# Patient Record
Sex: Male | Born: 1980 | Race: Black or African American | Hispanic: No | Marital: Single | Smoking: Current every day smoker
Health system: Southern US, Community
[De-identification: ages and names within clinical notes are randomized; demographics above are authoritative.]

## PROBLEM LIST (undated history)

## (undated) DIAGNOSIS — K610 Anal abscess: Secondary | ICD-10-CM

## (undated) DIAGNOSIS — I201 Angina pectoris with documented spasm: Secondary | ICD-10-CM

## (undated) DIAGNOSIS — I252 Old myocardial infarction: Secondary | ICD-10-CM

## (undated) DIAGNOSIS — E785 Hyperlipidemia, unspecified: Secondary | ICD-10-CM

## (undated) HISTORY — PX: OTHER SURGICAL HISTORY: SHX169

---

## 2000-05-04 ENCOUNTER — Emergency Department (HOSPITAL_COMMUNITY): Admission: EM | Admit: 2000-05-04 | Discharge: 2000-05-04 | Payer: Self-pay | Admitting: Emergency Medicine

## 2000-08-06 ENCOUNTER — Emergency Department (HOSPITAL_COMMUNITY): Admission: EM | Admit: 2000-08-06 | Discharge: 2000-08-06 | Payer: Self-pay | Admitting: Emergency Medicine

## 2001-01-20 ENCOUNTER — Emergency Department (HOSPITAL_COMMUNITY): Admission: EM | Admit: 2001-01-20 | Discharge: 2001-01-20 | Payer: Self-pay

## 2001-03-28 ENCOUNTER — Emergency Department (HOSPITAL_COMMUNITY): Admission: EM | Admit: 2001-03-28 | Discharge: 2001-03-28 | Payer: Self-pay | Admitting: Emergency Medicine

## 2001-11-16 ENCOUNTER — Emergency Department (HOSPITAL_COMMUNITY): Admission: EM | Admit: 2001-11-16 | Discharge: 2001-11-17 | Payer: Self-pay | Admitting: Emergency Medicine

## 2005-01-20 ENCOUNTER — Emergency Department (HOSPITAL_COMMUNITY): Admission: EM | Admit: 2005-01-20 | Discharge: 2005-01-20 | Payer: Self-pay | Admitting: Emergency Medicine

## 2006-01-23 ENCOUNTER — Emergency Department (HOSPITAL_COMMUNITY): Admission: EM | Admit: 2006-01-23 | Discharge: 2006-01-24 | Payer: Self-pay | Admitting: Emergency Medicine

## 2006-01-26 ENCOUNTER — Emergency Department (HOSPITAL_COMMUNITY): Admission: EM | Admit: 2006-01-26 | Discharge: 2006-01-26 | Payer: Self-pay | Admitting: Family Medicine

## 2006-08-31 ENCOUNTER — Emergency Department (HOSPITAL_COMMUNITY): Admission: EM | Admit: 2006-08-31 | Discharge: 2006-08-31 | Payer: Self-pay | Admitting: Emergency Medicine

## 2006-09-03 ENCOUNTER — Emergency Department (HOSPITAL_COMMUNITY): Admission: EM | Admit: 2006-09-03 | Discharge: 2006-09-03 | Payer: Self-pay | Admitting: *Deleted

## 2006-09-22 ENCOUNTER — Emergency Department (HOSPITAL_COMMUNITY): Admission: EM | Admit: 2006-09-22 | Discharge: 2006-09-22 | Payer: Self-pay | Admitting: Emergency Medicine

## 2007-01-23 ENCOUNTER — Inpatient Hospital Stay (HOSPITAL_COMMUNITY): Admission: EM | Admit: 2007-01-23 | Discharge: 2007-01-26 | Payer: Self-pay | Admitting: Emergency Medicine

## 2007-03-13 ENCOUNTER — Emergency Department (HOSPITAL_COMMUNITY): Admission: EM | Admit: 2007-03-13 | Discharge: 2007-03-13 | Payer: Self-pay | Admitting: Emergency Medicine

## 2007-04-18 ENCOUNTER — Encounter (INDEPENDENT_AMBULATORY_CARE_PROVIDER_SITE_OTHER): Payer: Self-pay | Admitting: Cardiology

## 2007-04-18 ENCOUNTER — Ambulatory Visit (HOSPITAL_COMMUNITY): Admission: RE | Admit: 2007-04-18 | Discharge: 2007-04-18 | Payer: Self-pay | Admitting: Cardiology

## 2007-07-05 ENCOUNTER — Emergency Department (HOSPITAL_COMMUNITY): Admission: EM | Admit: 2007-07-05 | Discharge: 2007-07-06 | Payer: Self-pay | Admitting: Emergency Medicine

## 2007-08-07 ENCOUNTER — Emergency Department (HOSPITAL_COMMUNITY): Admission: EM | Admit: 2007-08-07 | Discharge: 2007-08-07 | Payer: Self-pay | Admitting: Emergency Medicine

## 2007-10-04 ENCOUNTER — Ambulatory Visit: Payer: Self-pay | Admitting: *Deleted

## 2008-03-11 ENCOUNTER — Emergency Department (HOSPITAL_COMMUNITY): Admission: EM | Admit: 2008-03-11 | Discharge: 2008-03-11 | Payer: Self-pay | Admitting: Emergency Medicine

## 2008-07-30 ENCOUNTER — Emergency Department (HOSPITAL_COMMUNITY): Admission: EM | Admit: 2008-07-30 | Discharge: 2008-07-30 | Payer: Self-pay | Admitting: Emergency Medicine

## 2008-08-08 ENCOUNTER — Emergency Department (HOSPITAL_COMMUNITY): Admission: EM | Admit: 2008-08-08 | Discharge: 2008-08-08 | Payer: Self-pay | Admitting: Emergency Medicine

## 2010-04-06 ENCOUNTER — Inpatient Hospital Stay (INDEPENDENT_AMBULATORY_CARE_PROVIDER_SITE_OTHER)
Admission: RE | Admit: 2010-04-06 | Discharge: 2010-04-06 | Disposition: A | Discharge status: Home / Self Care | Payer: Self-pay | Source: Ambulatory Visit | Attending: Family Medicine | Admitting: Family Medicine

## 2010-04-06 DIAGNOSIS — K029 Dental caries, unspecified: Secondary | ICD-10-CM

## 2010-07-07 NOTE — Cardiovascular Report (Signed)
[name], Phillip [name] NO.:  [id number]   MEDICAL RECORD NO.:  [id number]          PATIENT TYPE:  INP   LOCATION:  2807                         FACILITY:  [facility]   PHYSICIAN:  Ritta Slot, MD     DATE OF BIRTH:  1980/03/22   DATE OF PROCEDURE:  01/23/2007  DATE OF DISCHARGE:                            CARDIAC CATHETERIZATION   PRIMARY CARDIOLOGIST:  Dr. Ritta Slot.   PROCEDURE:  1. Cardiac catheterization.  2. Retrograde coronary angiography.  3. Selective visualization of the coronary arteries.  4. Left ventriculography.  5. Intracoronary nitroglycerin administration.   INDICATIONS FOR PROCEDURE:  This is a 30 year old smoker who presented  to [facility] on January 23, 2007 with 7/10 chest pain with ST  elevation, inferior leads.  He was walking for his bus at 7:30 this  morning when he noticed chest discomfort and tightness all across his  chest with no radiation.  He came to [facility] ER where he was seen and  evaluated.  A 12-lead ECG demonstrated an inferior ST elevation MI.   PROCEDURE:  As a consequence, he was brought to the cardiac  catheterization laboratory emergently for further visualization of his  coronary angiogram.  The patient was, after explanation of the risks and  benefits of the procedure, he consented to undergoing coronary  angiography emergently.  He was brought to the second floor of the [facility] to the cardiac catheterization laboratory.  He was prepped  and draped in the usual sterile fashion.  His right groin was shaved and  cleaned with an aseptic technique.  Ten milliliters of 1% lidocaine was  infiltrated in his right groin for local anesthesia.  He was given 1 mg  of versed and 25 mcg of fentanyl intravenously for sedation.  The right  groin was accessed using a Cook needle.  A J-wire was passed into the  Henri[place] needle into the RFA to access the groin.  The Cook needle was  removed and a 6 [place]  sheath was placed into the right femoral artery.  Through the 6 French sheath was placed a 6 [place] catheter.  It was  passed into the right coronary artery.  Views of the right coronary  artery were obtained in the RAO cranial and LAO cranial approach.  Of  concern, there was significant damping at the time of insertion of the 6  [place] JR4 catheter.  Two hundred milliliters of nitroglycerin were  injected into the right coronary artery to alleviate the spasm.  The JR4  catheter was then exchanged over the J-wire for a JL4 catheter.  The JL4  catheter was then engaged into the left main coronary artery.  Selective  visualization of the left main coronary artery of the left anterior  descending and the left circumflex system were performed in the LAO  cranial, LAO caudal, RAO cranial, RAO caudal views.  The JL4 catheter  was then removed and the 5 [place] JR4 catheter was then reintroduced  into the right coronary artery to asses the degree of spasm.  Selective  visualization  of the RCA was then repeated in the RAO cranial and LAO  cranial views.  The 5 [place] JR4 catheter was then exchanged over the  pigtail with a 6 French bent pigtail catheter.  The pigtail catheter was  then inserted into the left ventricle with difficulty.  Thirty-six  milliliters of contrast were injected at a rate of 12 mL per second into  the left ventricle.  Visualization of left ventricle was performed in  the area of 45-degree oblique.  The pigtail catheter was then removed  off the J-wire.  The RFA stick site was then assessed using 5 mL of dye  with mixed 5 mL of saline.  Visualization of the right femoral artery  was performed at 45 degrees RAO oblique views.  The stick was deemed to  be suitable for insertion of StarClose device.  This was performed  smoothly without any complications.  One gram of IV Ancef was used as  antibiotic coverage.   FINDINGS:  1. AO pressure 109/70/89.  2. LV pressure is  102/0/11.  3. LV pullout pressure 103/4/7.  4. Aortic pullout pressure 105/60/87.  There was no evidence of      significant aortic gradient by the pullback technique.   Coronary angiography:  1. Right coronary artery with significant spasm at the proximal ostium      of the right coronary artery.  This was relieved with 200 mcg of      intracoronary nitroglycerin.  Repeat angiography of the right      coronary artery after insertion of intracoronary nitroglycerin      demonstrated no evidence of significant stenosis.  The right      coronary artery was large, dominant and supplying the PDA.  2. The left main was a medium-sized vessel, free of any disease.  The      circumflex system gave rise to a high OM-1 and OM-2 and an OM-3.      It was a large vessel without any evidence of significant disease.      The LAD was long and wrapped around the whole way of the apex.  It      gave rise to the septals and diagonals.  There was no evidence of      any disease in the diagonals or septals.  There was a small 100%      occlusion at the tip of the LAD at the apex.  Two hundred      micrograms of nitroglycerin were injected down the LAD to see if      there would be any relief of this obstruction.  There was no      significant relief of the obstruction.   Left ventriculography:  The EF was entirely normal except for a  hypokinetic apex which was significant, giving an EF of around 50 to  55%.   FINAL DIAGNOSES:  1. 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  2. Left ventriculography demonstrating an ejection fraction of 50 to      55% with apical hypokinesis.   PLAN:  The patient will be treated with aspirin 325 mg daily, Plavix 75  mg and then he will be on heparin drip for 48 hours inpatient.  We will  then reassess him and just let him for medical therapy.  The  angiograms  were reviewed by Dr. Lenise [name], who agrees with the above plan.      Ritta Slot, MD  Electronically Signed     HS/MEDQ  D:  01/23/2007  T:  01/23/2007  Job:  [id number]

## 2010-07-07 NOTE — Discharge Summary (Signed)
NAMEJI, FELDNER NO.:  [id number]   MEDICAL RECORD NO.:  [id number]          PATIENT TYPE:  INP   LOCATION:  3735                         FACILITY:  [facility]   PHYSICIAN:  Ritta Slot, MD     DATE OF BIRTH:  [birth year]   DATE OF ADMISSION:  01/23/2007  DATE OF DISCHARGE:  01/26/2007                               DISCHARGE SUMMARY   DISCHARGE DIAGNOSES:  1. 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  2. History of smoking.  3. Family history of coronary disease.   HOSPITAL COURSE:  The patient is a 30 year old male with no prior  history of coronary disease who presented to the emergency room January 23, 2007, with substernal chest pain.  Please see admission history and  physical for complete details.  In the emergency room, he had inferior  ST elevation.  He was seen by Dr. Lynnea Ferrier and taken urgently to the cath  lab.  Catheterization revealed normal RCA, normal left main normal ramus  intermedius, normal circumflex and total distal LAD occlusion suspected  to be thromboembolism.  EF was 55% with some apical akinesis.  The  patient was monitored in the CCU.  CKs peaked at 440 with 32 MB.  His  EKG normalized.  He was transferred to floor and ambulated.  He received  tobacco cessation counseling.  We feel he can be discharged January 26, 2007.  He will follow up with Dr. Lynnea Ferrier.  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   DISCHARGE MEDICATIONS:  1. Coated aspirin 325 mg a day.  2. Plavix 75 mg a day.  3. Metoprolol 25 mg b.i.d.  4. Lisinopril 10 mg a day.  5. Simvastatin 40 mg a day.  6. Nitroglycerin sublingual p.r.n.   LABORATORY DATA:  CK peaked at 440 with 32 MB.  Sodium 139, potassium  3.9, BUN 6, creatinine 1.  White count 11.1, hemoglobin 14.7, hematocrit  42.8, platelets 157.  Hypercoagulable panel was  obtained.  Homocysteine  is 11.4.  Drug screen was negative.  TSH is 1.15.  D-dimers less than  0.22.  UA was negative.  INR is 1.   DISPOSITION:  The patient discharged in stable condition and will follow  up with Dr. Lynnea Ferrier.     [name], P.A.      Ritta Slot, MD  Electronically Signed   LKK/MEDQ  D:  01/26/2007  T:  01/26/2007  Job:  [id number]

## 2010-07-07 NOTE — H&P (Signed)
Phillip [name], [name] NO.:  [id number]   MEDICAL RECORD NO.:  [id number]          PATIENT TYPE:  INP   LOCATION:  2807                         FACILITY:  [facility]   PHYSICIAN:  Phillip Derrick, P.A.   DATE OF BIRTH:  1980/03/02   DATE OF ADMISSION:  01/23/2007  DATE OF DISCHARGE:                              HISTORY & PHYSICAL   CHIEF COMPLAINT:  Chest pain.   HISTORY OF PRESENT ILLNESS:  The patient is a 30 year old African  American male with no prior history of coronary disease.  He presented  to the emergency room with complaints of substernal chest pressure.  The  patient said that he was walking to the bus stop about 9:30 this morning  when he developed substernal chest pressure with diaphoresis.  He went  home, but his symptoms did not subside and he came to the emergency room  at [facility].  In the ER, he had inferior ST elevation.  He is still  complaining of pain in the emergency room, 5/10.   His past medical history is negative, he has seen a dentist in the past  once or twice, but has never has serious medical problems.   HE HAS NO KNOWN DRUG ALLERGIES.   He is currently on no medications.   SOCIAL HISTORY:  He smokes a pack a day.  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  He is single and lives alone.   FAMILY HISTORY:  His mother died at 26 of complications of heart  failure.   REVIEW OF SYSTEMS:  Essentially unremarkable except for noted above.  He  denies any history of hypertension, dyslipidemia, or diabetes.   PHYSICAL EXAMINATION:  VITAL SIGNS:  Blood pressure 128/85, pulse 75,  respirations 12.  GENERAL:  He is a well-developed, thin [place] American male who is  anxious, but in no acute distress.  HEENT:  Normocephalic.  Extraocular movements are intact.  Sclerae is  nonicteric.  NECK:  Without JVD or bruits.  CHEST:  Clear to auscultation and percussion with diminished breath  sounds.  CARDIAC EXAM:  Reveals regular rate and  rhythm with diminished heart  sounds.  ABDOMEN:  Nontender.  Not distended.  EXTREMITIES:  Without edema.  Pulses are intact.  NEURO EXAM:  Is grossly intact.  He is awake, alert, and oriented.  Cooperative.  Moves all extremities without obvious deficits.  SKIN:  Warm and dry.   IMPRESSION:  1. Unstable angina.  Rule out acute myocardial infarction.  2. History of smoking.  3. Family history of early cardiac death.   PLAN:  The patient was seen by Dr. Lynnea Ferrier in the emergency room.  Risks  and benefits of urgent cardiac catheterization were explained to the  patient, including the risk of death, stroke, and groin bleeding.  The  patient understands and is willing to proceed.  He was taken emergently  to the cath lab.      Phillip Derrick, P.ALenard [name]  D:  01/23/2007  T:  01/23/2007  Job:  [id number]

## 2010-11-18 LAB — POCT CARDIAC MARKERS
CKMB, poc: <1 — ABNORMAL LOW
Operator id: 295131
Troponin i, poc: <0.05

## 2010-11-30 LAB — BASIC METABOLIC PANEL
BUN: 6
CO2: 23
CO2: 27
Calcium: 8.7
Chloride: 106
Creatinine, Ser: 0.84
Creatinine, Ser: 0.91
GFR calc Af Amer: >60
GFR calc Af Amer: >60
GFR calc Af Amer: >60
GFR calc non Af Amer: >60
GFR calc non Af Amer: >60
Glucose, Bld: 135 — ABNORMAL HIGH
Potassium: 3.9
Potassium: 4
Sodium: 137
Sodium: 137
Sodium: 139

## 2010-11-30 LAB — CBC
HCT: 38.7 — ABNORMAL LOW
HCT: 42.7
HCT: 47.6
Hemoglobin: 12.9 — ABNORMAL LOW
Hemoglobin: 13.3
Hemoglobin: 14.2
Hemoglobin: 15.2
Hemoglobin: 16.2
MCHC: 33.2
MCHC: 33.5
MCHC: 33.9
MCHC: 34.1
MCHC: 34.3
MCV: 94.3
MCV: 95.3
MCV: 95.8
MCV: 96.5
MCV: 96.6
Platelets: 150
Platelets: 157
Platelets: 157
RBC: 3.92 — ABNORMAL LOW
RBC: 4.04 — ABNORMAL LOW
RBC: 4.12 — ABNORMAL LOW
RBC: 4.69
RBC: 4.99
RDW: 13.7
RDW: 13.8
RDW: 13.9
RDW: 14
RDW: 14.3
WBC: 11.1 — ABNORMAL HIGH
WBC: 12.6 — ABNORMAL HIGH
WBC: 14.1 — ABNORMAL HIGH
WBC: 14.5 — ABNORMAL HIGH

## 2010-11-30 LAB — COMPREHENSIVE METABOLIC PANEL
ALT: 20
AST: 20
Albumin: 3.7
Alkaline Phosphatase: 76
BUN: 6
Calcium: 9.1
Creatinine, Ser: 0.9
GFR calc Af Amer: >60
GFR calc non Af Amer: >60

## 2010-11-30 LAB — I-STAT 8, (EC8 V) (CONVERTED LAB)
Acid-base deficit: 1
BUN: 9
Bicarbonate: 22.9
HCT: 52
Hemoglobin: 17.7 — ABNORMAL HIGH
Operator id: 234501
Potassium: 3.8
TCO2: 24
pCO2, Ven: 37 — ABNORMAL LOW
pH, Ven: 7.399 — ABNORMAL HIGH

## 2010-11-30 LAB — URINE DRUGS OF ABUSE SCREEN W ALC, ROUTINE (REF LAB)
Benzodiazepines.: POSITIVE — AB
Cocaine Metabolites: NEGATIVE
Creatinine,U: 38.1
Opiate Screen, Urine: NEGATIVE
Phencyclidine (PCP): NEGATIVE

## 2010-11-30 LAB — CARDIAC PANEL(CRET KIN+CKTOT+MB+TROPI)
CK, MB: 20.6 — ABNORMAL HIGH
CK, MB: 32.5 — ABNORMAL HIGH
CK, MB: 38 — ABNORMAL HIGH
Relative Index: 7.4 — ABNORMAL HIGH
Total CK: 361 — ABNORMAL HIGH
Total CK: 432 — ABNORMAL HIGH
Troponin I: 0.71
Troponin I: 4.53

## 2010-11-30 LAB — DIFFERENTIAL
Basophils Absolute: 0.1
Basophils Relative: 1
Eosinophils Absolute: 0.3
Eosinophils Relative: 2
Lymphocytes Relative: 25
Lymphs Abs: 3.2
Monocytes Absolute: 0.9
Monocytes Relative: 7
Neutro Abs: 8.1 — ABNORMAL HIGH
Neutrophils Relative %: 65

## 2010-11-30 LAB — POCT CARDIAC MARKERS
Myoglobin, poc: 37.5
Operator id: 234501
Troponin i, poc: <0.05

## 2010-11-30 LAB — URINALYSIS, ROUTINE W REFLEX MICROSCOPIC
Hgb urine dipstick: NEGATIVE
Ketones, ur: NEGATIVE
Protein, ur: NEGATIVE

## 2010-11-30 LAB — POCT I-STAT CREATININE: Operator id: 234501

## 2010-11-30 LAB — PROTIME-INR
INR: 1
Prothrombin Time: 13.2
Prothrombin Time: 13.3

## 2010-11-30 LAB — BENZODIAZEPINE, QUANTITATIVE, URINE
Alprazolam (GC/LC/MS), ur confirm: NEGATIVE
Oxazepam GC/MS Conf: NEGATIVE

## 2010-11-30 LAB — LUPUS ANTICOAGULANT PANEL: PTT Lupus Anticoagulant: 44.4 (ref 36.3–48.8)

## 2010-11-30 LAB — HEPARIN LEVEL (UNFRACTIONATED)
Heparin Unfractionated: 0.66
Heparin Unfractionated: <0.1 — ABNORMAL LOW

## 2010-11-30 LAB — PROTEIN C, TOTAL: Protein C, Total: 71 % (ref 70–140)

## 2010-11-30 LAB — PROTHROMBIN GENE MUTATION

## 2010-11-30 LAB — BETA-2-GLYCOPROTEIN I ABS, IGG/M/A: Beta-2-Glycoprotein I IgA: <4 U/mL (ref <10)

## 2010-11-30 LAB — PROTEIN C ACTIVITY: Protein C Activity: 131 % (ref 75–133)

## 2010-11-30 LAB — FACTOR 5 LEIDEN

## 2010-11-30 LAB — APTT: aPTT: 30

## 2010-11-30 LAB — PROTEIN S, TOTAL: Protein S Ag, Total: 100 % (ref 70–140)

## 2010-11-30 LAB — PROTEIN S ACTIVITY: Protein S Activity: 146 % — ABNORMAL HIGH (ref 69–129)

## 2010-11-30 LAB — CARDIOLIPIN ANTIBODIES, IGG, IGM, IGA: Anticardiolipin IgG: <7 — ABNORMAL LOW (ref <11)

## 2011-03-02 ENCOUNTER — Emergency Department (HOSPITAL_COMMUNITY)
Admission: EM | Admit: 2011-03-02 | Discharge: 2011-03-02 | Disposition: A | Discharge status: Home / Self Care | Payer: Self-pay | Attending: Emergency Medicine | Admitting: Emergency Medicine

## 2011-03-02 ENCOUNTER — Emergency Department (HOSPITAL_COMMUNITY): Payer: Self-pay

## 2011-03-02 DIAGNOSIS — L0390 Cellulitis, unspecified: Secondary | ICD-10-CM | POA: Insufficient documentation

## 2011-03-02 DIAGNOSIS — L0291 Cutaneous abscess, unspecified: Secondary | ICD-10-CM | POA: Insufficient documentation

## 2011-03-02 LAB — POCT I-STAT, CHEM 8
Calcium, Ion: 1.12 mmol/L (ref 1.12–1.32)
Chloride: 103 mEq/L (ref 96–112)
Glucose, Bld: 77 mg/dL (ref 70–99)
HCT: 42 % (ref 39.0–52.0)
Hemoglobin: 14.3 g/dL (ref 13.0–17.0)
TCO2: 27 mmol/L (ref 0–100)

## 2011-03-02 LAB — DIFFERENTIAL
Basophils Absolute: 0.1 10*3/uL (ref 0.0–0.1)
Basophils Relative: 1 % (ref 0–1)
Eosinophils Absolute: 0.5 10*3/uL (ref 0.0–0.7)
Eosinophils Relative: 5 % (ref 0–5)
Monocytes Absolute: 0.8 10*3/uL (ref 0.1–1.0)
Monocytes Relative: 7 % (ref 3–12)
Neutro Abs: 6.1 10*3/uL (ref 1.7–7.7)

## 2011-03-02 LAB — CBC
HCT: 39 % (ref 39.0–52.0)
Hemoglobin: 13.3 g/dL (ref 13.0–17.0)
MCH: 31.9 pg (ref 26.0–34.0)
MCHC: 34.1 g/dL (ref 30.0–36.0)
RDW: 14.4 % (ref 11.5–15.5)

## 2011-03-02 MED ORDER — HYDROCODONE-ACETAMINOPHEN 5-325 MG PO TABS: 1.0000 | ORAL_TABLET | Freq: Four times a day (QID) | ORAL | Status: AC | PRN

## 2011-03-02 MED ORDER — IOHEXOL 300 MG/ML  SOLN: 80.0000 mL | Freq: Once | INTRAMUSCULAR | Status: DC | PRN

## 2011-03-02 MED ORDER — MORPHINE SULFATE 4 MG/ML IJ SOLN
4.0000 mg | Freq: Once | INTRAMUSCULAR | Status: AC
  Administered 2011-03-02: 4 mg via INTRAVENOUS
  Filled 2011-03-02: qty 1

## 2011-03-02 MED ORDER — DOXYCYCLINE HYCLATE 100 MG PO CAPS: 100.0000 mg | ORAL_CAPSULE | Freq: Two times a day (BID) | ORAL | Status: AC

## 2011-03-02 MED ORDER — SODIUM CHLORIDE 0.9 % IV SOLN
Freq: Once | INTRAVENOUS | Status: AC
  Administered 2011-03-02: 12:00:00 via INTRAVENOUS

## 2011-03-02 NOTE — ED Notes (Signed)
Abscess noted to lt.buttokcs.

## 2011-03-02 NOTE — ED Provider Notes (Signed)
Medical screening examination/treatment/procedure(s) were performed by non-physician practitioner and as supervising physician I was immediately available for consultation/collaboration.   Dione Booze, MD 03/02/11 2045

## 2011-03-02 NOTE — ED Notes (Signed)
Pt placed in gown and given sheet. PA at bedside.

## 2011-03-02 NOTE — ED Notes (Signed)
Pt to ED for eval of reoccurring abscess to inside of left gluteus maximus. Denies drainage or fever. States he has seen doctor and had area lanced and given antibiotics before. Pt reports area is nickel size. Reports moderate discomfort.

## 2011-03-02 NOTE — ED Provider Notes (Signed)
History     CSN: [id number]  Arrival date & time 03/02/11  1038   First MD Initiated Contact with Patient 03/02/11 1134      Chief Complaint  Patient presents with  . Abscess    (Consider location/radiation/quality/duration/timing/severity/associated sxs/prior treatment) HPI Comments: Patient reports two days of pain beside his anus.  States he has had abscesses in that area before and has had to see a surgeon to drain them.  Denies fevers, abdominal pain, penile discharge or pain, testicular pain or swelling, change in bowel movements, hematochezia.    Patient is a 31 y.o. male presenting with abscess. The history is provided by the patient.  Abscess     History reviewed. No pertinent past medical history.  History reviewed. No pertinent past surgical history.  History reviewed. No pertinent family history.  History  Substance Use Topics  . Smoking status: Never Smoker   . Smokeless tobacco: Not on file  . Alcohol Use: No      Review of Systems  Allergies  Review of patient's allergies indicates no known allergies.  Home Medications   Current Outpatient Rx  Name Route Sig Dispense Refill  . IBUPROFEN 200 MG PO TABS Oral Take 200 mg by mouth 2 (two) times daily as needed. For pain       BP 112/76  Pulse 78  Temp(Src) 98.1 F (36.7 C) (Oral)  Resp 16  Ht 5\' 6"  (1.676 m)  Wt 150 lb (68.04 kg)  BMI 24.21 kg/m2  SpO2 100%  Physical Exam  Nursing note and vitals reviewed. Constitutional: He is oriented to person, place, and time. He appears well-developed and well-nourished.  HENT:  Head: Normocephalic and atraumatic.  Neck: Neck supple.  Pulmonary/Chest: Effort normal.  Genitourinary: Testes normal and penis normal. Right testis shows no mass, no swelling and no tenderness. Left testis shows no mass, no swelling and no tenderness. Circumcised. No penile tenderness.          Scrotum nontender, no erythema or edema.    Neurological: He is alert and  oriented to person, place, and time.  Psychiatric: He has a normal mood and affect.    ED Course  Procedures (including critical care time)  Labs Reviewed  CBC - Abnormal; Notable for the following:    RBC 4.17 (*)    All other components within normal limits  DIFFERENTIAL  POCT I-STAT, CHEM 8  I-STAT, CHEM 8   Ct Pelvis W Contrast  03/02/2011  *RADIOLOGY REPORT*  Clinical Data:  Perianal versus perirectal abscess.  CT PELVIS WITH CONTRAST  Technique:  Multidetector CT imaging of the pelvis was performed using the standard protocol following the bolus administration of intravenous contrast.  Contrast:   80 ml Omnipaque-300  Comparison:  None  Findings:  The rectum, sigmoid colon and visualized pelvic small bowel loops are unremarkable.  The appendix is normal.  No intrapelvic abscess or abnormal fluid collection.  The bladder, prostate gland and seminal vesicles are unremarkable.  No perianal abnormalities identified.  The ischiorectal fat is normal.  No pelvic lymphadenopathy.  No inguinal lymphadenopathy.  The bony pelvis is unremarkable.  IMPRESSION:  No CT findings for pelvic abscess.  No perirectal or perianal abscess is identified.  MR is the best test to evaluate this disease process and is recommended if symptoms persist.  Original Report Authenticated By: P. Loralie Champagne, M.D.     1. Cellulitis       MDM  Patient with small area of perianal  swelling and tenderness.  CT ordered ? perianal vs perirectal abscess vs other mass - CT shows no abnormality.  Unclear etiology of tender area, will treat as early cellulitis.  Genital exam is completely unremarkable - so signs of inflammation or infection.  No e/o Fournier's gangrene.  Discussed all findings with patient and plan for close follow up, return for any worsening symptoms.          Rise Patience, [place] 03/02/11 1551

## 2011-03-15 ENCOUNTER — Emergency Department (HOSPITAL_COMMUNITY)
Admission: EM | Admit: 2011-03-15 | Discharge: 2011-03-15 | Disposition: A | Discharge status: Home / Self Care | Payer: Self-pay | Attending: Emergency Medicine | Admitting: Emergency Medicine

## 2011-03-15 ENCOUNTER — Encounter (HOSPITAL_COMMUNITY): Payer: Self-pay | Admitting: *Deleted

## 2011-03-15 DIAGNOSIS — R6884 Jaw pain: Secondary | ICD-10-CM | POA: Insufficient documentation

## 2011-03-15 DIAGNOSIS — K029 Dental caries, unspecified: Secondary | ICD-10-CM | POA: Insufficient documentation

## 2011-03-15 DIAGNOSIS — K089 Disorder of teeth and supporting structures, unspecified: Secondary | ICD-10-CM | POA: Insufficient documentation

## 2011-03-15 MED ORDER — PENICILLIN V POTASSIUM 500 MG PO TABS: 500.0000 mg | ORAL_TABLET | Freq: Four times a day (QID) | ORAL | Status: AC

## 2011-03-15 MED ORDER — OXYCODONE-ACETAMINOPHEN 5-325 MG PO TABS: 1.0000 | ORAL_TABLET | ORAL | Status: AC | PRN

## 2011-03-15 MED ORDER — OXYCODONE-ACETAMINOPHEN 5-325 MG PO TABS
1.0000 | ORAL_TABLET | Freq: Once | ORAL | Status: AC
  Administered 2011-03-15: 1 via ORAL
  Filled 2011-03-15: qty 1

## 2011-03-15 NOTE — ED Notes (Signed)
Rx x 2, pt voiced understanding to f/u with denist tomorrow

## 2011-03-15 NOTE — ED Notes (Signed)
Toothache for 2 days 

## 2011-03-15 NOTE — ED Provider Notes (Signed)
History     CSN: [id number]  Arrival date & time 03/15/11  1710   First MD Initiated Contact with Patient 03/15/11 1846      Chief Complaint  Patient presents with  . Dental Pain    (Consider location/radiation/quality/duration/timing/severity/associated sxs/prior treatment) Patient is a 31 y.o. male presenting with tooth pain.  Dental PainThe primary symptoms include mouth pain. Primary symptoms do not include fever, shortness of breath or cough. The symptoms began 3 to 5 days ago. The symptoms are worsening. The symptoms are new. The symptoms occur constantly.  Mouth pain occurs constantly. Mouth pain is worsening. Affected locations include: teeth and cheek(s). Pain scale currently for mouth pain: moderate.  Additional symptoms include: jaw pain. Additional symptoms do not include: gum swelling, purulent gums, trismus, facial swelling, trouble swallowing and taste disturbance.    History reviewed. No pertinent past medical history.  History reviewed. No pertinent past surgical history.  No family history on file.  History  Substance Use Topics  . Smoking status: Never Smoker   . Smokeless tobacco: Not on file  . Alcohol Use: No      Review of Systems  Constitutional: Negative for fever.  HENT: Negative for congestion, facial swelling and trouble swallowing.   Respiratory: Negative for cough and shortness of breath.   Cardiovascular: Negative for chest pain.  Gastrointestinal: Negative for nausea, vomiting, abdominal pain and diarrhea.  Genitourinary: Negative for difficulty urinating.  Skin: Negative for rash.  All other systems reviewed and are negative.    Allergies  Review of patient's allergies indicates no known allergies.  Home Medications   Current Outpatient Rx  Name Route Sig Dispense Refill  . ACETAMINOPHEN 325 MG PO TABS Oral Take 650 mg by mouth every 6 (six) hours as needed. For pain    . DOXYCYCLINE HYCLATE 100 MG PO CAPS Oral Take 100 mg by  mouth 2 (two) times daily. For 7 days; Start date 03/02/11      BP 130/88  Pulse 83  Temp(Src) 97.6 F (36.4 C) (Oral)  Resp 18  SpO2 97%  Physical Exam  Nursing note and vitals reviewed. Constitutional: He is oriented to person, place, and time. He appears well-developed and well-nourished. No distress.  HENT:  Head: Normocephalic and atraumatic. No trismus in the jaw.  Mouth/Throat: Oropharynx is clear and moist and mucous membranes are normal. No oral lesions. Dental caries present. No dental abscesses or uvula swelling.    Eyes: Conjunctivae are normal. Pupils are equal, round, and reactive to light. No scleral icterus.  Neck: Normal range of motion. Neck supple.  Cardiovascular: Normal rate, regular rhythm, normal heart sounds and intact distal pulses.   No murmur heard. Pulmonary/Chest: Effort normal and breath sounds normal. No stridor. No respiratory distress. He has no wheezes. He has no rales.  Abdominal: Soft. He exhibits no distension. There is no tenderness.  Musculoskeletal: Normal range of motion. He exhibits no edema.  Neurological: He is alert and oriented to person, place, and time.  Skin: Skin is warm and dry. No rash noted.  Psychiatric: He has a normal mood and affect. His behavior is normal.    ED Course  Procedures (including critical care time)  Labs Reviewed - No data to display No results found.   1. Dental caries       MDM  31 yo male with 3 days of dental pain and jaw pain on first lower left molar.  No fevers, no facial swelling. Severe dental caries.  No trismus.  Suspect dental caries with possible underlying dental abscess.  Don't suspect ludwig's angina or mastoiditis.  Will treat with PCN and have patient follow up with Dentistry.          Warnell Forester, MD 03/16/11 [phone]

## 2011-03-16 NOTE — ED Provider Notes (Signed)
I saw and evaluated the patient, reviewed the resident's note and I agree with the findings and plan.  Burnette Valenti T Shakara Tweedy, MD 03/16/11 0946 

## 2011-09-16 ENCOUNTER — Emergency Department (HOSPITAL_COMMUNITY)
Admission: EM | Admit: 2011-09-16 | Discharge: 2011-09-17 | Disposition: A | Discharge status: Home / Self Care | Payer: Self-pay | Attending: Emergency Medicine | Admitting: Emergency Medicine

## 2011-09-16 ENCOUNTER — Encounter (HOSPITAL_COMMUNITY): Payer: Self-pay | Admitting: *Deleted

## 2011-09-16 DIAGNOSIS — K649 Unspecified hemorrhoids: Secondary | ICD-10-CM

## 2011-09-16 DIAGNOSIS — K644 Residual hemorrhoidal skin tags: Secondary | ICD-10-CM | POA: Insufficient documentation

## 2011-09-16 NOTE — ED Notes (Signed)
Patient with recurring abcess on his left buttock area.  No drainage

## 2011-09-17 MED ORDER — DSS 100 MG PO CAPS: 100.0000 mg | ORAL_CAPSULE | Freq: Two times a day (BID) | ORAL | Status: AC

## 2011-09-17 MED ORDER — HYDROCORTISONE ACETATE 25 MG RE SUPP: 25.0000 mg | Freq: Two times a day (BID) | RECTAL | Status: AC

## 2011-09-17 MED ORDER — HYDROCORTISONE ACETATE 25 MG RE SUPP
25.0000 mg | Freq: Once | RECTAL | Status: AC
  Administered 2011-09-17: 25 mg via RECTAL
  Filled 2011-09-17 (×2): qty 1

## 2011-09-17 MED ORDER — DOCUSATE SODIUM 100 MG PO CAPS
100.0000 mg | ORAL_CAPSULE | Freq: Once | ORAL | Status: AC
  Administered 2011-09-17: 100 mg via ORAL
  Filled 2011-09-17: qty 1

## 2011-09-17 NOTE — ED Notes (Signed)
Hydrocortisone ordered from pharmacy

## 2011-09-17 NOTE — ED Notes (Signed)
Colase ordered from pharmacy.

## 2011-09-17 NOTE — ED Provider Notes (Signed)
History     CSN: [id number]  Arrival date & time 09/16/11  2137   First MD Initiated Contact with Patient 09/17/11 0025      Chief Complaint  Patient presents with  . Recurrent Skin Infections    (Consider location/radiation/quality/duration/timing/severity/associated sxs/prior treatment) HPI Comments: Patient has been complaining of a small bulge in the rectal area for the past 2, days.  Painful bowel movements.  He has a history of recurrent perirectal abscesses, although this does not feel the same.  Has not done anything for it can prior to arrival.  He denies any fever, myalgias  The history is provided by the patient.    History reviewed. No pertinent past medical history.  History reviewed. No pertinent past surgical history.  History reviewed. No pertinent family history.  History  Substance Use Topics  . Smoking status: Never Smoker   . Smokeless tobacco: Not on file  . Alcohol Use: No      Review of Systems  Constitutional: Negative for fever.  Gastrointestinal: Positive for rectal pain. Negative for diarrhea and constipation.  Genitourinary: Negative for penile pain and testicular pain.  Musculoskeletal: Negative for myalgias.  Neurological: Negative for weakness.    Allergies  Review of patient's allergies indicates no known allergies.  Home Medications   Current Outpatient Rx  Name Route Sig Dispense Refill  . IBUPROFEN 200 MG PO TABS Oral Take 800 mg by mouth every 6 (six) hours as needed. For pain    . DSS 100 MG PO CAPS Oral Take 100 mg by mouth 2 (two) times daily. 10 capsule 0  . HYDROCORTISONE ACETATE 25 MG RE SUPP Rectal Place 1 suppository (25 mg total) rectally 2 (two) times daily. 12 suppository 0    BP 105/67  Pulse 84  Temp 98.3 F (36.8 C) (Oral)  Resp 16  SpO2 99%  Physical Exam  Constitutional: He appears well-developed and well-nourished.  HENT:  Head: Normocephalic.  Eyes: Pupils are equal, round, and reactive to light.    Neck: Normal range of motion.  Cardiovascular: Normal rate.   Pulmonary/Chest: Effort normal.  Abdominal: He exhibits no distension. There is tenderness.  Genitourinary: Rectal exam shows external hemorrhoid.       Soft easily reduced.  External hemorrhoid, without evidence of thrombosis    ED Course  Procedures (including critical care time)  Labs Reviewed - No data to display No results found.   1. Hemorrhoid       MDM   Patient has a soft easily reduced.  Hemorrhoid.  Will prescribe Colace to keep his stools soft and Anusol suppositories with general surgery followup if needed        Arman Filter, NP 09/17/11 0111  Arman Filter, NP 09/17/11 0111

## 2011-09-17 NOTE — ED Provider Notes (Signed)
Medical screening examination/treatment/procedure(s) were performed by non-physician practitioner and as supervising physician I was immediately available for consultation/collaboration.  Davionte Lusby R. Fleming Prill, MD 09/17/11 0718 

## 2011-12-19 ENCOUNTER — Emergency Department (HOSPITAL_COMMUNITY)
Admission: EM | Admit: 2011-12-19 | Discharge: 2011-12-19 | Disposition: A | Discharge status: Home / Self Care | Payer: Self-pay | Attending: Emergency Medicine | Admitting: Emergency Medicine

## 2011-12-19 ENCOUNTER — Encounter (HOSPITAL_COMMUNITY): Payer: Self-pay | Admitting: Emergency Medicine

## 2011-12-19 DIAGNOSIS — Y9289 Other specified places as the place of occurrence of the external cause: Secondary | ICD-10-CM | POA: Insufficient documentation

## 2011-12-19 DIAGNOSIS — Z8679 Personal history of other diseases of the circulatory system: Secondary | ICD-10-CM | POA: Insufficient documentation

## 2011-12-19 DIAGNOSIS — X088XXA Exposure to other specified smoke, fire and flames, initial encounter: Secondary | ICD-10-CM | POA: Insufficient documentation

## 2011-12-19 DIAGNOSIS — F17200 Nicotine dependence, unspecified, uncomplicated: Secondary | ICD-10-CM | POA: Insufficient documentation

## 2011-12-19 DIAGNOSIS — Y93G9 Activity, other involving cooking and grilling: Secondary | ICD-10-CM | POA: Insufficient documentation

## 2011-12-19 DIAGNOSIS — T300 Burn of unspecified body region, unspecified degree: Secondary | ICD-10-CM

## 2011-12-19 DIAGNOSIS — I259 Chronic ischemic heart disease, unspecified: Secondary | ICD-10-CM | POA: Insufficient documentation

## 2011-12-19 DIAGNOSIS — Z9861 Coronary angioplasty status: Secondary | ICD-10-CM | POA: Insufficient documentation

## 2011-12-19 DIAGNOSIS — T2010XA Burn of first degree of head, face, and neck, unspecified site, initial encounter: Secondary | ICD-10-CM | POA: Insufficient documentation

## 2011-12-19 MED ORDER — NAPROXEN 500 MG PO TABS: 500.0000 mg | ORAL_TABLET | Freq: Two times a day (BID) | ORAL | Status: DC

## 2011-12-19 MED ORDER — HYDROCODONE-ACETAMINOPHEN 5-325 MG PO TABS: 1.0000 | ORAL_TABLET | Freq: Four times a day (QID) | ORAL | Status: DC | PRN

## 2011-12-19 NOTE — ED Notes (Signed)
Pt. 's airway intact , no distress noted.  Pt. Does not have any burnt nose hairs.  Does have singed eyelashes.
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
## 2012-05-01 ENCOUNTER — Emergency Department (INDEPENDENT_AMBULATORY_CARE_PROVIDER_SITE_OTHER): Payer: Self-pay

## 2012-05-01 ENCOUNTER — Encounter (HOSPITAL_COMMUNITY): Payer: Self-pay | Admitting: Emergency Medicine

## 2012-05-01 ENCOUNTER — Emergency Department (INDEPENDENT_AMBULATORY_CARE_PROVIDER_SITE_OTHER)
Admission: EM | Admit: 2012-05-01 | Discharge: 2012-05-01 | Disposition: A | Discharge status: Home / Self Care | Payer: Self-pay | Source: Home / Self Care

## 2012-05-01 DIAGNOSIS — IMO0002 Reserved for concepts with insufficient information to code with codable children: Secondary | ICD-10-CM

## 2012-05-01 DIAGNOSIS — S39012A Strain of muscle, fascia and tendon of lower back, initial encounter: Secondary | ICD-10-CM

## 2012-05-01 MED ORDER — TROLAMINE SALICYLATE 10 % EX CREA: TOPICAL_CREAM | CUTANEOUS | Status: DC | PRN

## 2012-05-01 MED ORDER — RABIES VACCINE, PCEC IM SUSR
INTRAMUSCULAR | Status: AC
  Filled 2012-05-01: qty 1

## 2012-05-01 MED ORDER — NAPROXEN 250 MG PO TABS: 250.0000 mg | ORAL_TABLET | Freq: Two times a day (BID) | ORAL | Status: DC
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
## 2012-09-29 ENCOUNTER — Encounter (HOSPITAL_COMMUNITY): Payer: Self-pay | Admitting: Emergency Medicine

## 2012-09-29 ENCOUNTER — Emergency Department (HOSPITAL_COMMUNITY)
Admission: EM | Admit: 2012-09-29 | Discharge: 2012-09-29 | Disposition: A | Discharge status: Home / Self Care | Payer: No Typology Code available for payment source | Attending: Emergency Medicine | Admitting: Emergency Medicine

## 2012-09-29 ENCOUNTER — Emergency Department (HOSPITAL_COMMUNITY): Payer: No Typology Code available for payment source

## 2012-09-29 DIAGNOSIS — M79609 Pain in unspecified limb: Secondary | ICD-10-CM | POA: Insufficient documentation

## 2012-09-29 DIAGNOSIS — I2510 Atherosclerotic heart disease of native coronary artery without angina pectoris: Secondary | ICD-10-CM | POA: Insufficient documentation

## 2012-09-29 DIAGNOSIS — M7989 Other specified soft tissue disorders: Secondary | ICD-10-CM | POA: Insufficient documentation

## 2012-09-29 DIAGNOSIS — F17200 Nicotine dependence, unspecified, uncomplicated: Secondary | ICD-10-CM | POA: Insufficient documentation

## 2012-09-29 DIAGNOSIS — M79644 Pain in right finger(s): Secondary | ICD-10-CM

## 2012-09-29 DIAGNOSIS — Z9861 Coronary angioplasty status: Secondary | ICD-10-CM | POA: Insufficient documentation

## 2012-09-29 DIAGNOSIS — Z8674 Personal history of sudden cardiac arrest: Secondary | ICD-10-CM | POA: Insufficient documentation

## 2012-09-29 NOTE — ED Notes (Signed)
Pt presents to ED with right index finger pain.  Pt states he pinched his finger 2 years ago and has intermittent pain since.  Pt has noticed increased size of finger over the past week with increased pain.

## 2012-09-29 NOTE — ED Provider Notes (Signed)
CSN: [id number]     Arrival date & time 09/29/12  1527 History  This chart was scribed for non-physician practitioner, Jimmye Norman, NP, working with Enid Skeens, MD by Shari Heritage, ED Scribe. This patient was seen in room TR10C/TR10C and the patient's care was started at 1614.    First MD Initiated Contact with Patient 09/29/12 1614     Chief Complaint  Patient presents with  . Hand Pain    Patient is a 32 y.o. male presenting with hand pain. The history is provided by the patient. No language interpreter was used.  Hand Pain This is a new problem. The current episode started 2 days ago. The problem occurs constantly. The problem has been gradually worsening. Pertinent negatives include no headaches. Nothing aggravates the symptoms. Nothing relieves the symptoms. He has tried nothing for the symptoms.     HPI Comments: Phillip [name] is a 32 y.o. male who presents to the Emergency Department complaining of pain to right index finger with associated swelling onset 2 days ago. Pain is moderate in quality and intermittent in timing. Pain is worse with palpation. Patient states that swelling has increased since initial onset of symptoms. He denies any other symptoms at this time. He does not take any medicines regularly. He denies history of diabetes. He is a current every day smoker.   Past Medical History  Diagnosis Date  . Past heart attack   . Coronary artery disease   . History of right heart catheterization 2008   History reviewed. No pertinent past surgical history. No family history on file. History  Substance Use Topics  . Smoking status: Current Every Day Smoker  . Smokeless tobacco: Not on file  . Alcohol Use: Yes    Review of Systems  Constitutional: Negative for fever.  HENT: Negative for sore throat.   Gastrointestinal: Negative for nausea.  Musculoskeletal: Negative for back pain.  Neurological: Negative for headaches.  All other systems reviewed and are  negative.    Allergies  Review of patient's allergies indicates no known allergies.  Home Medications   Current Outpatient Rx  Name  Route  Sig  Dispense  Refill  . ibuprofen (ADVIL,MOTRIN) 200 MG tablet   Oral   Take 800 mg by mouth every 6 (six) hours as needed for pain or headache.          Triage Vitals: BP 127/77  Pulse 69  Temp(Src) 97.9 F (36.6 C) (Oral)  Resp 16  Ht 5\' 6"  (1.676 m)  Wt 130 lb (58.968 kg)  BMI 20.99 kg/m2  SpO2 100%  Physical Exam  Nursing note and vitals reviewed. Constitutional: He is oriented to person, place, and time. He appears well-developed and well-nourished. No distress.  HENT:  Head: Normocephalic and atraumatic.  Eyes: EOM are normal.  Neck: Neck supple. No tracheal deviation present.  Cardiovascular: Normal rate.   Pulmonary/Chest: Effort normal. No respiratory distress.  Musculoskeletal: Normal range of motion.       Right hand: He exhibits swelling. Normal sensation noted.  Swelling to the palmar aspect of the DIP joint of the right index finger. Sensation to all right fingers is intact.  Neurological: He is alert and oriented to person, place, and time.  Skin: Skin is warm and dry.  Psychiatric: He has a normal mood and affect. His behavior is normal.    ED Course   Procedures (including critical care time) Apiration of blood/fluid Performed by: Jimmye Norman Consent obtained.  Patient identity  confirmed: verbally with patient Time out: Immediately prior to procedure a "time out" was called to verify the correct patient, procedure, equipment, support staff and site/side marked as required. Preparation: Patient was prepped and draped in the usual sterile fashion. Patient tolerance: Patient tolerated the procedure well with no immediate complications.  Location of aspiration: MIP right index finger, palmer aspect  Local anesthetic with 1 cc 2% lidocaine.  Needle aspiration attempted, no return of fluid.  DIAGNOSTIC  STUDIES: Bedside ultrasound performed by Dr. Jodi Mourning. Oxygen Saturation is 100% on room air, normal by my interpretation.    COORDINATION OF CARE: 4:19 PM- Will order a an x-ray of right index finger to rule out bony fracture. Suspect soft tissue involvement only. Patient informed of current plan for treatment and evaluation and agrees with plan at this time.   5:09 PM- Patient's X-ray showed soft tissue swelling volar to second middle phalanx with no specific etiology. Spoke with attending physician Dr. Blane Ohara regarding patient's case. He will perform a bedside US for more information.   Labs Reviewed - No data to display   Dg Finger Index Right  09/29/2012   *RADIOLOGY REPORT*  Clinical Data: Pain and swelling  RIGHT INDEX FINGER 2+V  Comparison: None.  Findings: Frontal, oblique, and lateral views were obtained.  There is marked swelling volar to the second middle phalanx.  There is no soft tissue air, calcification, or radiopaque foreign body in this region.  No fracture or dislocation.  Joint spaces appear intact. No erosive change or bony destruction.  IMPRESSION: Soft tissue swelling volar to the second middle phalanx of uncertain etiology.  No soft tissue calcification or radiopaque foreign body identified.  No soft tissue air in this region.  No bony abnormality.   Original Report Authenticated By: Bretta Bang, M.D.    No diagnosis found.  MDM  Right index finger pain with soft tissue swelling palmer aspect of MIP.  Patient to follow-up with hand specialist.         Jimmye Norman, NP 09/29/12 1737

## 2012-10-01 NOTE — ED Provider Notes (Signed)
Medical screening examination/treatment/procedure(s) were conducted as a shared visit with non-physician practitioner(s) or resident  and myself.  I personally evaluated the patient during the encounter and agree with the findings and plan unless otherwise indicated.  Focal swelling.  No other signs of infection.  Focal pain on exam, no streaking or warmth.   Bedside US small amount of fluid. No pus on aspiration.  Close fup discussed.   EMERGENCY DEPARTMENT US SOFT TISSUE INTERPRETATION "Study: Limited Ultrasound of the noted body part in comments below"  INDICATIONS: Pain and Other (refer to comments) Multiple views of the body part are obtained with a multi-frequency linear probe  PERFORMED BY:  Myself  IMAGES ARCHIVED?: Yes  SIDE:Right   BODY PART:Other soft tisse (comment in note) index finger  FINDINGS: Other small fluid collection present  INTERPRETATION:  Small fluid collection present    Phillip Skeens, MD 10/01/12 0028

## 2012-11-22 ENCOUNTER — Emergency Department (HOSPITAL_COMMUNITY): Payer: PRIVATE HEALTH INSURANCE

## 2012-11-22 ENCOUNTER — Emergency Department (HOSPITAL_COMMUNITY)
Admission: EM | Admit: 2012-11-22 | Discharge: 2012-11-22 | Disposition: A | Discharge status: Home / Self Care | Payer: PRIVATE HEALTH INSURANCE | Attending: Emergency Medicine | Admitting: Emergency Medicine

## 2012-11-22 ENCOUNTER — Encounter (HOSPITAL_COMMUNITY): Payer: Self-pay | Admitting: Emergency Medicine

## 2012-11-22 DIAGNOSIS — R071 Chest pain on breathing: Secondary | ICD-10-CM | POA: Insufficient documentation

## 2012-11-22 DIAGNOSIS — F17200 Nicotine dependence, unspecified, uncomplicated: Secondary | ICD-10-CM | POA: Insufficient documentation

## 2012-11-22 DIAGNOSIS — I2510 Atherosclerotic heart disease of native coronary artery without angina pectoris: Secondary | ICD-10-CM | POA: Insufficient documentation

## 2012-11-22 DIAGNOSIS — R0789 Other chest pain: Secondary | ICD-10-CM

## 2012-11-22 DIAGNOSIS — Z8674 Personal history of sudden cardiac arrest: Secondary | ICD-10-CM | POA: Insufficient documentation

## 2012-11-22 DIAGNOSIS — R55 Syncope and collapse: Secondary | ICD-10-CM | POA: Insufficient documentation

## 2012-11-22 LAB — CBC WITH DIFFERENTIAL/PLATELET
Basophils Absolute: 0.1 10*3/uL (ref 0.0–0.1)
Eosinophils Absolute: 0.3 10*3/uL (ref 0.0–0.7)
Lymphocytes Relative: 28 % (ref 12–46)
Lymphs Abs: 3 10*3/uL (ref 0.7–4.0)
MCH: 31.9 pg (ref 26.0–34.0)
MCHC: 34.9 g/dL (ref 30.0–36.0)
MCV: 91.6 fL (ref 78.0–100.0)
Neutrophils Relative %: 59 % (ref 43–77)
Platelets: 215 10*3/uL (ref 150–400)
RBC: 4.04 MIL/uL — ABNORMAL LOW (ref 4.22–5.81)
RDW: 14 % (ref 11.5–15.5)
WBC: 10.6 10*3/uL — ABNORMAL HIGH (ref 4.0–10.5)

## 2012-11-22 LAB — POCT I-STAT TROPONIN I: Troponin i, poc: 0 ng/mL (ref 0.00–0.08)

## 2012-11-22 LAB — COMPREHENSIVE METABOLIC PANEL
ALT: 16 U/L (ref 0–53)
AST: 20 U/L (ref 0–37)
Albumin: 3.5 g/dL (ref 3.5–5.2)
CO2: 28 mEq/L (ref 19–32)
Calcium: 8.3 mg/dL — ABNORMAL LOW (ref 8.4–10.5)
Creatinine, Ser: 1 mg/dL (ref 0.50–1.35)
GFR calc non Af Amer: >90 mL/min (ref >90)
Sodium: 141 mEq/L (ref 135–145)
Total Protein: 6.4 g/dL (ref 6.0–8.3)

## 2012-11-22 MED ORDER — IBUPROFEN 800 MG PO TABS: 800.0000 mg | ORAL_TABLET | Freq: Three times a day (TID) | ORAL | Status: DC

## 2012-11-22 NOTE — ED Provider Notes (Signed)
CSN: [id number]     Arrival date & time 11/22/12  1320 History   First MD Initiated Contact with Patient 11/22/12 1408     Chief Complaint  Patient presents with  . Chest Pain   (Consider location/radiation/quality/duration/timing/severity/associated sxs/prior Treatment) Patient is a 32 y.o. male presenting with chest pain. The history is provided by the patient. No language interpreter was used.  Chest Pain Pain location:  L chest Pain quality: sharp   Pain radiates to:  Does not radiate Associated symptoms: no abdominal pain, no cough, no fever, no nausea, no shortness of breath and not vomiting   Associated symptoms comment:  He has had left lateral chest wall pain since falling after syncope 5 days ago. He reports one syncopal episode lasting less than 2 minutes after brief episode of dizziness. No chest pain, SOB, nausea, recent illness or history of syncope. Since that time, he has had pain in the left side and left lower chest with movement. No cough, pain with respiration, abdominal pain, nausea, fever or vomiting.   Past Medical History  Diagnosis Date  . Past heart attack   . Coronary artery disease   . History of right heart catheterization 2008   History reviewed. No pertinent past surgical history. No family history on file. History  Substance Use Topics  . Smoking status: Current Every Day Smoker  . Smokeless tobacco: Not on file  . Alcohol Use: Yes    Review of Systems  Constitutional: Negative for fever and chills.  Respiratory: Negative.  Negative for cough and shortness of breath.   Cardiovascular: Positive for chest pain.  Gastrointestinal: Negative.  Negative for nausea, vomiting and abdominal pain.  Musculoskeletal: Negative.  Negative for myalgias.  Skin: Negative.   Neurological: Positive for syncope.       See HPI.    Allergies  Review of patient's allergies indicates no known allergies.  Home Medications  No current outpatient prescriptions on  file. BP 109/69  Pulse 62  Temp(Src) 98.1 F (36.7 C)  Resp 17  SpO2 100% Physical Exam  Constitutional: He is oriented to person, place, and time. He appears well-developed and well-nourished.  HENT:  Head: Normocephalic.  Neck: Normal range of motion. Neck supple.  Cardiovascular: Normal rate and regular rhythm.   Pulmonary/Chest: Effort normal and breath sounds normal. He has no wheezes. He exhibits tenderness.  Abdominal: Soft. Bowel sounds are normal. There is no tenderness. There is no rebound and no guarding.  Musculoskeletal: Normal range of motion.  Neurological: He is alert and oriented to person, place, and time.  Skin: Skin is warm and dry. No rash noted.  Psychiatric: He has a normal mood and affect.    ED Course  Procedures (including critical care time) Labs Review Labs Reviewed  COMPREHENSIVE METABOLIC PANEL - Abnormal; Notable for the following:    Potassium 3.4 (*)    Calcium 8.3 (*)    All other components within normal limits  CBC WITH DIFFERENTIAL - Abnormal; Notable for the following:    WBC 10.6 (*)    RBC 4.04 (*)    Hemoglobin 12.9 (*)    HCT 37.0 (*)    All other components within normal limits  POCT I-STAT TROPONIN I   Date: 11/22/2012  Rate: 103  Rhythm: sinus tachycardia  QRS Axis: right  Intervals: normal  ST/T Wave abnormalities: normal  Conduction Disutrbances:none  Narrative Interpretation: Right atrial enlargement  Old EKG Reviewed: none available   Imaging Review Dg Chest 2  View  11/22/2012   CLINICAL DATA:  Left chest pain, fall  EXAM: CHEST  2 VIEW  COMPARISON:  None.  FINDINGS: Lungs are clear. No pleural effusion or pneumothorax.  The heart is normal in size.  Visualized osseous structures are within normal limits.  IMPRESSION: No evidence of acute cardiopulmonary disease.   Electronically Signed   By: Charline Bills M.D.   On: 11/22/2012 13:58    MDM  No diagnosis found. 1. Chest wall pain  Non-acute EKG, probable  vasovagal syncope 5 days ago without further symptoms, neg lab studies, neg CXR. Doubt ACS, cardiogenic syncope. Exam supports isolated syncope 5 days prior with chest wall pain from fall. Stable for discharge    Arnoldo Hooker, PA-C 11/22/12 1541

## 2012-11-22 NOTE — ED Notes (Signed)
Left rib pain since Sunday staes passed out ? Reason has hx of heart attack  In 2008

## 2012-11-24 NOTE — ED Provider Notes (Signed)
Medical screening examination/treatment/procedure(s) were performed by non-physician practitioner and as supervising physician I was immediately available for consultation/collaboration.   Tayron Hunnell T Zendayah Hardgrave, MD 11/24/12 1017 

## 2013-03-03 ENCOUNTER — Emergency Department (HOSPITAL_COMMUNITY)
Admission: EM | Admit: 2013-03-03 | Discharge: 2013-03-03 | Disposition: A | Discharge status: Home / Self Care | Payer: BC Managed Care – PPO | Attending: Emergency Medicine | Admitting: Emergency Medicine

## 2013-03-03 DIAGNOSIS — F17200 Nicotine dependence, unspecified, uncomplicated: Secondary | ICD-10-CM | POA: Insufficient documentation

## 2013-03-03 DIAGNOSIS — K047 Periapical abscess without sinus: Secondary | ICD-10-CM | POA: Insufficient documentation

## 2013-03-03 DIAGNOSIS — I2510 Atherosclerotic heart disease of native coronary artery without angina pectoris: Secondary | ICD-10-CM | POA: Insufficient documentation

## 2013-03-03 DIAGNOSIS — R609 Edema, unspecified: Secondary | ICD-10-CM | POA: Insufficient documentation

## 2013-03-03 DIAGNOSIS — I252 Old myocardial infarction: Secondary | ICD-10-CM | POA: Insufficient documentation

## 2013-03-03 DIAGNOSIS — Z9889 Other specified postprocedural states: Secondary | ICD-10-CM | POA: Insufficient documentation

## 2013-03-03 MED ORDER — PENICILLIN V POTASSIUM 500 MG PO TABS: 500.0000 mg | ORAL_TABLET | Freq: Three times a day (TID) | ORAL | Status: DC

## 2013-03-03 MED ORDER — OXYCODONE-ACETAMINOPHEN 5-325 MG PO TABS: 1.0000 | ORAL_TABLET | ORAL | Status: DC | PRN

## 2013-03-03 NOTE — ED Notes (Signed)
Pt c/o dental pain on lower L side of mouth. Symptoms for 3 days. Pt with no acute distress.

## 2013-03-03 NOTE — ED Provider Notes (Signed)
CSN: [id number]     Arrival date & time 03/03/13  1652 History  This chart was scribed for non-physician practitioner working with Suzi RootsKevin E Steinl, MD by Ashley JacobsBrittany Andrews, ED scribe. This patient was seen in room WTR8/WTR8 and the patient's care was started at 5:04 PM.   First MD Initiated Contact with Patient 03/03/13 1659     No chief complaint on file.  (Consider location/radiation/quality/duration/timing/severity/associated sxs/prior Treatment) HPI HPI Comments: Ander GasterLateef Esteve is a 33 y.o. male who presents to the Emergency Department complaining of constant, persistent, severe lower left dental pain that presented two days ago. He did not trying anything for pain. He was seen last year for dental carries. He denies any known allegies to medications. Pt currently smokes tobacco. He has a past medical hx of CAD.   Past Medical History  Diagnosis Date  . Past heart attack   . Coronary artery disease   . History of right heart catheterization 2008   No past surgical history on file. No family history on file. History  Substance Use Topics  . Smoking status: Current Every Day Smoker  . Smokeless tobacco: Not on file  . Alcohol Use: Yes    Review of Systems  Constitutional: Negative for fever.  HENT: Positive for dental problem.   Skin: Negative for rash.    Allergies  Review of patient's allergies indicates no known allergies.  Home Medications   Current Outpatient Rx  Name  Route  Sig  Dispense  Refill  . ibuprofen (ADVIL,MOTRIN) 200 MG tablet   Oral   Take 800 mg by mouth every 6 (six) hours as needed.          BP 124/71  Pulse 79  Temp(Src) 99 F (37.2 C) (Oral)  Resp 16  SpO2 99% Physical Exam  Nursing note and vitals reviewed. Constitutional: He is oriented to person, place, and time. He appears well-developed and well-nourished. No distress.  HENT:  Head: Atraumatic.  Mouth/Throat: Oropharynx is clear and moist and mucous membranes are normal. No trismus in  the jaw.  Second pre-molar decay Moderate tenderness the surrounding gingival gum line Facial swelling present No trismus  Eyes: EOM are normal. Pupils are equal, round, and reactive to light.  Neck: Normal range of motion. Neck supple. No tracheal deviation present.  Cardiovascular: Normal rate.   Pulmonary/Chest: Effort normal. No respiratory distress.  Abdominal: Soft. He exhibits no distension.  Musculoskeletal: Normal range of motion.  Neurological: He is alert and oriented to person, place, and time.  Skin: Skin is warm and dry.  Psychiatric: He has a normal mood and affect. His behavior is normal.    ED Course  Procedures (including critical care time) DIAGNOSTIC STUDIES: Oxygen Saturation is 99% on room air, normal by my interpretation.    COORDINATION OF CARE:  5:07 PM Discussed course of care with pt which includes antibiotics and percocet 5-325 mg. Pt understands and agrees. Labs Review Labs Reviewed - No data to display Imaging Review No results found.  EKG Interpretation   None       MDM   1. Periapical abscess with facial involvement    BP 124/71  Pulse 79  Temp(Src) 99 F (37.2 C) (Oral)  Resp 16  SpO2 99%   I personally performed the services described in this documentation, which was scribed in my presence. The recorded information has been reviewed and is accurate.       Fayrene HelperBowie Julicia Krieger, PA-C 03/03/13 1730

## 2013-03-03 NOTE — Discharge Instructions (Signed)
Abscessed Tooth  An abscessed tooth is an infection around your tooth. It may be caused by holes or damage to the tooth (cavity) or a dental disease. An abscessed tooth causes mild to very bad pain in and around the tooth. See your dentist right away if you have tooth or gum pain.  HOME CARE   Take your medicine as told. Finish it even if you start to feel better.   Do not drive after taking pain medicine.   Rinse your mouth (gargle) often with salt water ( teaspoon salt in 8 ounces of warm water).   Do not apply heat to the outside of your face.  GET HELP RIGHT AWAY IF:    You have a temperature by mouth above 102 F (38.9 C), not controlled by medicine.   You have chills and a very bad headache.   You have problems breathing or swallowing.   Your mouth will not open.   You develop puffiness (swelling) on the neck or around the eye.   Your pain is not helped by medicine.   Your pain is getting worse instead of better.  MAKE SURE YOU:    Understand these instructions.   Will watch your condition.   Will get help right away if you are not doing well or get worse.  Document Released: 07/28/2007 Document Revised: 05/03/2011 Document Reviewed: 05/19/2010  ExitCare Patient Information 2014 [employer].

## 2013-03-04 NOTE — ED Provider Notes (Signed)
Medical screening examination/treatment/procedure(s) were performed by non-physician practitioner and as supervising physician I was immediately available for consultation/collaboration.  EKG Interpretation   None         Tavone Caesar E Daymein Nunnery, MD 03/04/13 1457 

## 2013-11-30 ENCOUNTER — Emergency Department (HOSPITAL_COMMUNITY): Payer: BC Managed Care – PPO

## 2013-11-30 ENCOUNTER — Inpatient Hospital Stay (HOSPITAL_COMMUNITY)
Admission: EM | Admit: 2013-11-30 | Discharge: 2013-11-30 | DRG: 282 | Discharge status: Against Medical Advice | Payer: BC Managed Care – PPO | Attending: Cardiology | Admitting: Cardiology

## 2013-11-30 ENCOUNTER — Encounter (HOSPITAL_COMMUNITY)
Admission: EM | Discharge status: Against Medical Advice | Payer: Self-pay | Source: Home / Self Care | Attending: Cardiology

## 2013-11-30 ENCOUNTER — Encounter (HOSPITAL_COMMUNITY): Payer: Self-pay | Admitting: Emergency Medicine

## 2013-11-30 DIAGNOSIS — I252 Old myocardial infarction: Secondary | ICD-10-CM

## 2013-11-30 DIAGNOSIS — I214 Non-ST elevation (NSTEMI) myocardial infarction: Principal | ICD-10-CM

## 2013-11-30 DIAGNOSIS — I213 ST elevation (STEMI) myocardial infarction of unspecified site: Secondary | ICD-10-CM

## 2013-11-30 DIAGNOSIS — R079 Chest pain, unspecified: Secondary | ICD-10-CM

## 2013-11-30 DIAGNOSIS — R7989 Other specified abnormal findings of blood chemistry: Secondary | ICD-10-CM | POA: Diagnosis present

## 2013-11-30 DIAGNOSIS — Z8249 Family history of ischemic heart disease and other diseases of the circulatory system: Secondary | ICD-10-CM

## 2013-11-30 DIAGNOSIS — D72829 Elevated white blood cell count, unspecified: Secondary | ICD-10-CM | POA: Diagnosis present

## 2013-11-30 DIAGNOSIS — I2510 Atherosclerotic heart disease of native coronary artery without angina pectoris: Secondary | ICD-10-CM | POA: Diagnosis present

## 2013-11-30 DIAGNOSIS — R778 Other specified abnormalities of plasma proteins: Secondary | ICD-10-CM | POA: Diagnosis present

## 2013-11-30 DIAGNOSIS — F17210 Nicotine dependence, cigarettes, uncomplicated: Secondary | ICD-10-CM | POA: Diagnosis present

## 2013-11-30 HISTORY — PX: LEFT HEART CATHETERIZATION WITH CORONARY ANGIOGRAM: SHX5451

## 2013-11-30 HISTORY — DX: Hyperlipidemia, unspecified: E78.5

## 2013-11-30 LAB — TROPONIN I
TROPONIN I: 0.44 ng/mL — AB (ref <0.30)
TROPONIN I: 2 ng/mL — AB (ref <0.30)
TROPONIN I: 5.59 ng/mL — AB (ref <0.30)

## 2013-11-30 LAB — CBC
HEMATOCRIT: 39.5 % (ref 39.0–52.0)
Hemoglobin: 13.7 g/dL (ref 13.0–17.0)
MCH: 31.8 pg (ref 26.0–34.0)
MCHC: 34.7 g/dL (ref 30.0–36.0)
MCV: 91.6 fL (ref 78.0–100.0)
Platelets: 223 10*3/uL (ref 150–400)
RBC: 4.31 MIL/uL (ref 4.22–5.81)
RDW: 14.8 % (ref 11.5–15.5)
WBC: 17.1 10*3/uL — AB (ref 4.0–10.5)

## 2013-11-30 LAB — BASIC METABOLIC PANEL
Anion gap: 9 (ref 5–15)
BUN: 6 mg/dL (ref 6–23)
CO2: 26 meq/L (ref 19–32)
CREATININE: 0.92 mg/dL (ref 0.50–1.35)
Calcium: 8.2 mg/dL — ABNORMAL LOW (ref 8.4–10.5)
Chloride: 105 mEq/L (ref 96–112)
GFR calc Af Amer: >90 mL/min (ref >90)
GFR calc non Af Amer: >90 mL/min (ref >90)
Glucose, Bld: 91 mg/dL (ref 70–99)
Potassium: 4 mEq/L (ref 3.7–5.3)
Sodium: 140 mEq/L (ref 137–147)

## 2013-11-30 LAB — I-STAT TROPONIN, ED: Troponin i, poc: 0.21 ng/mL (ref 0.00–0.08)

## 2013-11-30 LAB — MRSA PCR SCREENING: MRSA BY PCR: NEGATIVE

## 2013-11-30 LAB — PROTIME-INR
INR: 1.08 (ref 0.00–1.49)
Prothrombin Time: 14 seconds (ref 11.6–15.2)

## 2013-11-30 LAB — TSH: TSH: 0.7 u[IU]/mL (ref 0.350–4.500)

## 2013-11-30 SURGERY — LEFT HEART CATHETERIZATION WITH CORONARY ANGIOGRAM
Anesthesia: LOCAL

## 2013-11-30 MED ORDER — ASPIRIN EC 81 MG PO TBEC: 81.0000 mg | DELAYED_RELEASE_TABLET | Freq: Every day | ORAL | Status: DC

## 2013-11-30 MED ORDER — MORPHINE SULFATE 4 MG/ML IJ SOLN
6.0000 mg | Freq: Once | INTRAMUSCULAR | Status: AC
  Administered 2013-11-30: 6 mg via INTRAVENOUS
  Filled 2013-11-30: qty 2

## 2013-11-30 MED ORDER — FENTANYL CITRATE 0.05 MG/ML IJ SOLN
INTRAMUSCULAR | Status: AC
  Filled 2013-11-30: qty 2

## 2013-11-30 MED ORDER — SODIUM CHLORIDE 0.9 % IJ SOLN
3.0000 mL | Freq: Two times a day (BID) | INTRAMUSCULAR | Status: DC
  Administered 2013-11-30: 3 mL via INTRAVENOUS

## 2013-11-30 MED ORDER — SODIUM CHLORIDE 0.9 % IV SOLN: 250.0000 mL | INTRAVENOUS | Status: DC | PRN

## 2013-11-30 MED ORDER — CLOPIDOGREL BISULFATE 75 MG PO TABS: 75.0000 mg | ORAL_TABLET | Freq: Every day | ORAL | Status: DC

## 2013-11-30 MED ORDER — METOPROLOL TARTRATE 12.5 MG HALF TABLET
12.5000 mg | ORAL_TABLET | Freq: Two times a day (BID) | ORAL | Status: DC
  Administered 2013-11-30: 12.5 mg via ORAL
  Filled 2013-11-30 (×2): qty 1

## 2013-11-30 MED ORDER — HEPARIN (PORCINE) IN NACL 2-0.9 UNIT/ML-% IJ SOLN
INTRAMUSCULAR | Status: AC
  Filled 2013-11-30: qty 1000

## 2013-11-30 MED ORDER — LIDOCAINE HCL (PF) 1 % IJ SOLN
INTRAMUSCULAR | Status: AC
  Filled 2013-11-30: qty 30

## 2013-11-30 MED ORDER — SODIUM CHLORIDE 0.9 % IV SOLN
1.0000 mL/kg/h | INTRAVENOUS | Status: DC
  Administered 2013-11-30: 1 mL/kg/h via INTRAVENOUS

## 2013-11-30 MED ORDER — HEPARIN (PORCINE) IN NACL 100-0.45 UNIT/ML-% IJ SOLN
750.0000 [IU]/h | INTRAMUSCULAR | Status: DC
  Administered 2013-11-30: 750 [IU]/h via INTRAVENOUS
  Filled 2013-11-30 (×2): qty 250

## 2013-11-30 MED ORDER — ATORVASTATIN CALCIUM 80 MG PO TABS
80.0000 mg | ORAL_TABLET | Freq: Every day | ORAL | Status: DC
  Filled 2013-11-30: qty 1

## 2013-11-30 MED ORDER — ONDANSETRON HCL 4 MG/2ML IJ SOLN: 4.0000 mg | Freq: Four times a day (QID) | INTRAMUSCULAR | Status: DC | PRN

## 2013-11-30 MED ORDER — HEPARIN SODIUM (PORCINE) 5000 UNIT/ML IJ SOLN: 5000.0000 [IU] | Freq: Three times a day (TID) | INTRAMUSCULAR | Status: DC

## 2013-11-30 MED ORDER — SODIUM CHLORIDE 0.9 % IJ SOLN
3.0000 mL | INTRAMUSCULAR | Status: DC | PRN
Start: 2013-11-30 — End: 2013-12-01

## 2013-11-30 MED ORDER — ASPIRIN EC 81 MG PO TBEC
81.0000 mg | DELAYED_RELEASE_TABLET | Freq: Every day | ORAL | Status: DC
  Filled 2013-11-30: qty 1

## 2013-11-30 MED ORDER — VERAPAMIL HCL 2.5 MG/ML IV SOLN
INTRAVENOUS | Status: AC
  Filled 2013-11-30: qty 2

## 2013-11-30 MED ORDER — SODIUM CHLORIDE 0.9 % IV SOLN: 1.0000 mL/kg/h | INTRAVENOUS | Status: DC

## 2013-11-30 MED ORDER — HEPARIN SODIUM (PORCINE) 1000 UNIT/ML IJ SOLN
INTRAMUSCULAR | Status: AC
  Filled 2013-11-30: qty 1

## 2013-11-30 MED ORDER — MORPHINE SULFATE 2 MG/ML IJ SOLN
2.0000 mg | INTRAMUSCULAR | Status: DC | PRN
  Administered 2013-11-30: 2 mg via INTRAVENOUS

## 2013-11-30 MED ORDER — MIDAZOLAM HCL 2 MG/2ML IJ SOLN
INTRAMUSCULAR | Status: AC
  Filled 2013-11-30: qty 2

## 2013-11-30 MED ORDER — NITROGLYCERIN 0.4 MG SL SUBL
0.4000 mg | SUBLINGUAL_TABLET | SUBLINGUAL | Status: DC | PRN
  Administered 2013-11-30 (×6): 0.4 mg via SUBLINGUAL
  Filled 2013-11-30: qty 1

## 2013-11-30 MED ORDER — SODIUM CHLORIDE 0.9 % IJ SOLN: 3.0000 mL | INTRAMUSCULAR | Status: DC | PRN

## 2013-11-30 MED ORDER — PNEUMOCOCCAL VAC POLYVALENT 25 MCG/0.5ML IJ INJ: 0.5000 mL | INJECTION | INTRAMUSCULAR | Status: DC

## 2013-11-30 MED ORDER — KETOROLAC TROMETHAMINE 30 MG/ML IJ SOLN
30.0000 mg | Freq: Once | INTRAMUSCULAR | Status: AC
  Administered 2013-11-30: 30 mg via INTRAVENOUS
  Filled 2013-11-30: qty 1

## 2013-11-30 MED ORDER — NITROGLYCERIN 0.4 MG SL SUBL: 0.4000 mg | SUBLINGUAL_TABLET | SUBLINGUAL | Status: DC | PRN

## 2013-11-30 MED ORDER — HEPARIN BOLUS VIA INFUSION
3000.0000 [IU] | Freq: Once | INTRAVENOUS | Status: AC
  Administered 2013-11-30: 3000 [IU] via INTRAVENOUS
  Filled 2013-11-30: qty 3000

## 2013-11-30 MED ORDER — MORPHINE SULFATE 2 MG/ML IJ SOLN
INTRAMUSCULAR | Status: AC
  Administered 2013-11-30: 2 mg via INTRAVENOUS
  Filled 2013-11-30: qty 1

## 2013-11-30 MED ORDER — CLOPIDOGREL BISULFATE 75 MG PO TABS
75.0000 mg | ORAL_TABLET | Freq: Every day | ORAL | Status: DC
  Administered 2013-11-30: 75 mg via ORAL
  Filled 2013-11-30: qty 1

## 2013-11-30 MED ORDER — ACETAMINOPHEN 325 MG PO TABS: 650.0000 mg | ORAL_TABLET | ORAL | Status: DC | PRN

## 2013-11-30 NOTE — Interval H&P Note (Signed)
Cath Lab Visit (complete for each Cath Lab visit)  Clinical Evaluation Leading to the Procedure:   ACS: Yes.    Non-ACS:    Anginal Classification: CCS IV  Anti-ischemic medical therapy: Minimal Therapy (1 class of medications)  Non-Invasive Test Results: No non-invasive testing performed  Prior CABG: No previous CABG      History and Physical Interval Note:  11/30/2013 6:01 PM  Phillip [name]  has presented today for surgery, with the diagnosis of cp  The various methods of treatment have been discussed with the patient and family. After consideration of risks, benefits and other options for treatment, the patient has consented to  Procedure(s): LEFT HEART CATHETERIZATION WITH CORONARY ANGIOGRAM (N/A) as a surgical intervention .  The patient's history has been reviewed, patient examined, no change in status, stable for surgery.  I have reviewed the patient's chart and labs.  Questions were answered to the patient's satisfaction.     VARANASI,JAYADEEP S.

## 2013-11-30 NOTE — Discharge Summary (Signed)
Phillip [name] left AMA tonight before I could get over to his room to discuss with him.  His hospital course is discussed briefly below:  33 yo M, prior coronary spasm, who presented with NSTEMI.  Underwent cath today by Dr. Eldridge DaceVaranasi which showed normal coronary arteries, LV EF on ventriculogram of 50% with apical hypokinesis.  Cath performed via R radial approach.  TR band protocol post procedure was completed.  Per nurse hemostasis achieved.  Chest pain free.  [name], [name], MD

## 2013-11-30 NOTE — Progress Notes (Signed)
ANTICOAGULATION CONSULT NOTE - Initial Consult  Pharmacy Consult for heparin Indication: chest pain/ACS  No Known Allergies  Patient Measurements: Weight: 120 lb (54.432 kg) Heparin Dosing Weight: 54kg  Vital Signs: Temp: 98.3 F (36.8 C) (10/09 0906) Temp Source: Oral (10/09 0906) BP: 104/67 mmHg (10/09 1245) Pulse Rate: 57 (10/09 1245)  Labs:  Recent Labs  11/30/13 0930  HGB 13.7  HCT 39.5  PLT 223  CREATININE 0.92  TROPONINI 0.44*    The CrCl is unknown because both a height and weight (above a minimum accepted value) are required for this calculation.   Medical History: Past Medical History  Diagnosis Date  . Past heart attack   . Coronary artery disease   . Hyperlipidemia     Medications:  Ibuprofen  Assessment: 33 year old male with past medical history of nstemi in 2008, has not taken any medications in past 5 years. Presents today with chest pain (resolved on admission) and elevated cardiac enzymes.   Medications will be optimized and when possible will take to cath. Heparin, asa, plavix, beta blocker started.  Goal of Therapy:  Heparin level 0.3-0.7 units/ml Monitor platelets by anticoagulation protocol: Yes   Plan:  Give 3000 units bolus x 1 Start heparin infusion at 750 units/hr Check anti-Xa level in 6 hours and daily while on heparin Continue to monitor H&H and platelets  Sheppard CoilFrank Takita Riecke PharmD., BCPS Clinical Pharmacist Pager [phone] 11/30/2013 2:08 PM

## 2013-11-30 NOTE — ED Notes (Signed)
Elevated troponin reported to dr. Fonnie Jarvisbednar

## 2013-11-30 NOTE — Progress Notes (Signed)
Utilization Review Completed.Kemyah Buser T10/10/2013  

## 2013-11-30 NOTE — ED Notes (Signed)
Pt reports intermittent sharp chest pain for the last several days. Today was ambulating to the bathroom around 8am developed chest pressure and tightness that didn't go away. Pt reports bilateral arm pain that lasted several minutes. Pt with hx of MI in 2008, no current PCP, no recent cardiology followup. VSS. 20g right hand 324mg  ASA, 2 nitro by EMS no relief in pain.

## 2013-11-30 NOTE — Progress Notes (Signed)
Pt called out for nurse.  Pt asked for visitor to leave.  Visitor said pt was asking her to bring him something to smoke.  Pt asked for security to be called to escort visitor to leave.  Visitor left on own. Pt states that visitor was the mother of his children, but he is not in a relationship with her anymore.  Explained to pt that he was not allowed to smoke while in the hospital and offered to call the doctor to ask for something to help with cravings.  Pt did not want this.  Pt calm once visitor left. Instructed pt to tell RN if he needs something to help with his cravings.

## 2013-11-30 NOTE — CV Procedure (Addendum)
       PROCEDURE:  Left heart catheterization with selective coronary angiography, left ventriculogram.  INDICATIONS:  Elevated troponin  The risks, benefits, and details of the procedure were explained to the patient.  The patient verbalized understanding and wanted to proceed.  Informed written consent was obtained.  PROCEDURE TECHNIQUE:  After Xylocaine anesthesia a 1F slender sheath was placed in the right radial artery with a single anterior needle wall stick.   Right coronary angiography was done using a Judkins R4 guide catheter.  Left coronary angiography was done using a Judkins L3.5 guide catheter.  Left ventriculography was done using a pigtail catheter.  A TR band was used for hemostasis.   CONTRAST:  Total of 50 cc.  COMPLICATIONS:  None.    HEMODYNAMICS:  Aortic pressure was 102/59; LV pressure was 107/7; LVEDP 12.  There was no gradient between the left ventricle and aorta.    ANGIOGRAPHIC DATA:   The left main coronary artery is angiographically normal.  The left anterior descending artery is a large vessel which wraps around the apex. There is no significant atherosclerotic disease. The distal LAD has recanalized compared to the prior study. There is a large first diagonal which is widely patent.  The left circumflex artery is a large vessel which is widely patent there is a large first obtuse marginal which is patent.  The right coronary artery is a large dominant vessel which appears angiographically normal.  LEFT VENTRICULOGRAM:  Left ventricular angiogram was done in the 30 RAO projection and revealed apical and distal inferior hypokinesis with overall normal systolic function. The estimated ejection fraction is 50%.  LVEDP was 12 mmHg.  IMPRESSIONS:  1. Normal left main coronary artery. 2. Normal left anterior descending artery and its branches. 3. Normal left circumflex artery and its branches. 4. Normal right coronary artery. 5. Overall normal left  ventricular systolic function; area of apical hypokinesis consistent with prior MI.  LVEDP 12 mmHg.  Ejection fraction 50%.  RECOMMENDATION:  Continue medical therapy. He needs to stop smoking. Will stop anticoagulation at this point. Possible discharge tomorrow if no other etiology of chest discomfort is found.  Consider medical therapy for vasospasm.

## 2013-11-30 NOTE — Progress Notes (Signed)
*  PRELIMINARY RESULTS* Echocardiogram 2D Echocardiogram has been performed.  Phillip [name], Phillip [name] 11/30/2013, 4:23 PM

## 2013-11-30 NOTE — ED Provider Notes (Signed)
CSN: [id number]     Arrival date & time 11/30/13  0848 History   First MD Initiated Contact with Patient 11/30/13 [phone]     Chief Complaint  Patient presents with  . Chest Pain     (Consider location/radiation/quality/duration/timing/severity/associated sxs/prior Treatment) HPI Comments: No history of recent trauma, recent travel, hormone use, recent surgery, hemoptysis, no family history of PE or DVT.  Patient is a 33 y.o. male presenting with chest pain. The history is provided by the patient.  Chest Pain Pain location:  L chest Pain quality: sharp   Pain radiates to:  Does not radiate Pain radiates to the back: no   Pain severity:  Mild Onset quality:  Gradual Duration:  5 days Timing: Constant waxing and waning pain for 5 days. Progression:  Waxing and waning (Nonpleuritic) Chronicity:  New Context: at rest   Context comment:  No activities cause it to wax and wane. Patient has history of MI in 2008. Appears to be from vasospasm based on reports without any significant blockage. Has not followup with cardiology since then due to for a financial Relieved by:  Nothing Worsened by:  Nothing tried Ineffective treatments:  Nitroglycerin Associated symptoms: no abdominal pain, no altered mental status, no cough, no fever, no lower extremity edema, no nausea, no numbness, no shortness of breath, not vomiting and no weakness   Risk factors: male sex and smoking   Risk factors: no high cholesterol, no hypertension and not obese   Risk factors comment:  History of coronary vasospasm   Past Medical History  Diagnosis Date  . Past heart attack   . Coronary artery disease   . Hyperlipidemia    Past Surgical History  Procedure Laterality Date  . No previous surgeries     Family History  Problem Relation Age of Onset  . Heart disease Mother     CHF   History  Substance Use Topics  . Smoking status: Current Every Day Smoker  . Smokeless tobacco: Not on file  . Alcohol Use:  Yes     Comment: Occasional    Review of Systems  Constitutional: Negative for fever, activity change and appetite change.  HENT: Negative for congestion and rhinorrhea.   Eyes: Negative for discharge and itching.  Respiratory: Negative for cough, shortness of breath and wheezing.   Cardiovascular: Positive for chest pain.  Gastrointestinal: Negative for nausea, vomiting, abdominal pain, diarrhea and constipation.  Genitourinary: Negative for hematuria, decreased urine volume and difficulty urinating.  Skin: Negative for rash and wound.  Neurological: Negative for syncope, weakness and numbness.  All other systems reviewed and are negative.     Allergies  Review of patient's allergies indicates no known allergies.  Home Medications   Prior to Admission medications   Medication Sig Start Date End Date Taking? Authorizing Provider  ibuprofen (ADVIL,MOTRIN) 200 MG tablet Take 800 mg by mouth every 6 (six) hours as needed (pain).    Yes Historical Provider, MD   BP 104/67  Pulse 57  Temp(Src) 98.3 F (36.8 C) (Oral)  Resp 12  Wt 120 lb (54.432 kg)  SpO2 100% Physical Exam  Vitals reviewed. Constitutional: He is oriented to person, place, and time. He appears well-developed and well-nourished. No distress.  HENT:  Head: Normocephalic and atraumatic.  Mouth/Throat: Oropharynx is clear and moist. No oropharyngeal exudate.  Eyes: Conjunctivae and EOM are normal. Pupils are equal, round, and reactive to light. Right eye exhibits no discharge. Left eye exhibits no discharge. No scleral  icterus.  Neck: Normal range of motion. Neck supple.  Cardiovascular: Normal rate, regular rhythm, normal heart sounds and intact distal pulses.  Exam reveals no gallop and no friction rub.   No murmur heard. Pulmonary/Chest: Effort normal and breath sounds normal. No respiratory distress. He has no wheezes. He has no rales.  Abdominal: Soft. He exhibits no distension and no mass. There is no  tenderness.  Musculoskeletal: Normal range of motion. He exhibits no edema (no LE edema or calf ttp).  Neurological: He is alert and oriented to person, place, and time. No cranial nerve deficit. He exhibits normal muscle tone. Coordination normal.  Skin: Skin is warm. No rash noted. He is not diaphoretic.    ED Course  Procedures (including critical care time) Labs Review Labs Reviewed  CBC - Abnormal; Notable for the following:    WBC 17.1 (*)    All other components within normal limits  BASIC METABOLIC PANEL - Abnormal; Notable for the following:    Calcium 8.2 (*)    All other components within normal limits  TROPONIN I - Abnormal; Notable for the following:    Troponin I 0.44 (*)    All other components within normal limits  I-STAT TROPOININ, ED - Abnormal; Notable for the following:    Troponin i, poc 0.21 (*)    All other components within normal limits    Imaging Review Dg Chest 2 View  11/30/2013   CLINICAL DATA:  Left chest pain  EXAM: CHEST  2 VIEW  COMPARISON:  11/22/2012  FINDINGS: The heart size and mediastinal contours are within normal limits. Both lungs are clear. The visualized skeletal structures are unremarkable.  IMPRESSION: No active cardiopulmonary disease.   Electronically Signed   By: Elige KoHetal  Patel   On: 11/30/2013 10:07     EKG Interpretation   Date/Time:  Friday November 30 2013 09:00:22 EDT Ventricular Rate:  58 PR Interval:  131 QRS Duration: 88 QT Interval:  386 QTC Calculation: 379 R Axis:   109 Text Interpretation:  Sinus rhythm Right axis deviation Borderline low  voltage, extremity leads RSR' in V1 or V2, probably normal variant ST  elev, probable normal early repol pattern Compared to previous tracing  Rate slower Confirmed by [facility]  MD, Jonny RuizJOHN ([id number]) on 11/30/2013 9:15:15 AM      MDM    MDM: 33 year old [place] male with history of vasospasm-induced MI 7 years ago lost to followup with chief complaint of chest pain. Patient  states 5 days of constant intermittent severity sharpness  with episodes of dullness as well. Denies shortness of breath nausea vomiting diarrhea fevers chills. Not consistent with PE and perc negative. Low-risk by [facility]. EKG without STEMI. Received aspirin prior to arrival. [facility] treat with Toradol. No relief with Toradol, NTG given, some improvement. Morphine given with resolution. Trop positive. Admit to Cards. HDS while in Ed.   Final diagnoses:  Chest pain    Admit to Cardiology    Pilar Jarvisoug Marialuisa Basara, MD 11/30/13 1320

## 2013-11-30 NOTE — Progress Notes (Signed)
5/10 CP has returned.  One NTG SL did not help.  Morphine did in the ER.  We will try that again.  2mg  IV.  No acute EKG changes.   On O2.  BP is on the low side. 107/76  Ivanka Kirshner, PAC

## 2013-11-30 NOTE — ED Provider Notes (Signed)
I saw and evaluated the patient, reviewed the resident's note and I agree with the findings and plan.   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, states for the last few days has had a few episodes today each lasting just a few seconds of sharp stabbing chest pain without associated symptoms that he did not think much about because he has those chest pains off-and-on once every few months and they only lasted a few seconds, today however he has just over one hour of gradual onset left-sided chest tightness pressure feeling without radiation or sharp pain without stabbing pain without positional pain without cough without fever without shortness of breath without associated symptoms and his pressure is constant mild for over an hour different than his prior sharp stabbing pains so he came to the ED for evaluation. Nitroglycerin from EMS did not help his chest pressure today. Chest wall is nontender on examination.  Hurman HornJohn M Jemery Stacey, MD 12/09/13 [phone]

## 2013-11-30 NOTE — Progress Notes (Signed)
Pt pulled off telemetry leads and called out for nurse. Pt was standing at the side of the bed fully dressed with his back pack on. He stated he was going to leave AMA due to "family issues". Pt was advised not to leave at least until TR band was completely off. TR band off at 2015, site is level 0. Clear dressing applied and patient instructed to hold firm pressure to site if it were to start bleeding after leaving the hospital. Advised patient not to leave however, pt refused. Dr. Jon BillingsSivak made aware and at bedside. Pt agreed to sign AMA paperwork and was walked out of unit.   Toula MoosABERION, Antoino Westhoff J

## 2013-11-30 NOTE — Progress Notes (Signed)
Pt c/o 7/10 chest pain.  Given 3 SL nitro with no effect.  Trish with cardiology notified who will send a provider to see pt at bedside.  12 lead EKG obtained.  Will continue to monitor.

## 2013-11-30 NOTE — ED Notes (Signed)
offered pt a lunch bag. Pt declined it and said his wife would go and get him something to eat.

## 2013-11-30 NOTE — H&P (Signed)
Primary cardiologist: new  HPI: 33 year old male for evaluation of NSTEMI. Cardiac catheterization in 2008 following NSTEMI showed spasm in the ostium of the right coronary. There was a small 100% occlusion at the tip of the LAD. Ejection fraction was 50-55%. Echocardiogram in February of 2009 showed normal LV function. Patient treated medically but has not taken any medications in the past 5 years. He typically does not have significant dyspnea on exertion, orthopnea, PND, pedal edema, palpitations or syncope. He has had occasional brief chest pain for years that lasts 1-2 seconds. This morning he went to the bathroom and subsequently developed chest tightness that was diffuse. No associated symptoms. The pain radiated to his arms bilaterally. Not pleuritic or positional. Patient came to the emergency room and pain resolved with morphine and nitroglycerin. Presently pain free. Total duration of pain approximately 2 hours. Cardiology asked to evaluate.    (Not in a hospital admission)  No Known Allergies  Past Medical History  Diagnosis Date  . Past heart attack   . Coronary artery disease   . History of right heart catheterization 2008    History reviewed. No pertinent past surgical history.  History   Social History  . Marital Status: Single    Spouse Name: N/A    Number of Children: N/A  . Years of Education: N/A   Occupational History  . Not on file.   Social History Main Topics  . Smoking status: Current Every Day Smoker  . Smokeless tobacco: Not on file  . Alcohol Use: Yes  . Drug Use: No  . Sexual Activity:    Other Topics Concern  . Not on file   Social History Narrative  . No narrative on file    History reviewed. No pertinent family history.  ROS:  no fevers or chills, productive cough, hemoptysis, dysphasia, odynophagia, melena, hematochezia, dysuria, hematuria, rash, seizure activity, orthopnea, PND, pedal edema, claudication. Remaining systems are  negative.  Physical Exam:   Blood pressure 104/68, pulse 52, temperature 98.3 F (36.8 C), temperature source Oral, resp. rate 13, weight 120 lb (54.432 kg), SpO2 100.00%.  General:  Well developed/well nourished in NAD Skin warm/dry Patient not depressed No peripheral clubbing Back-normal HEENT-normal/normal eyelids Neck supple/normal carotid upstroke bilaterally; no bruits; no JVD; no thyromegaly chest - CTA/ normal expansion CV - RRR/normal S1 and S2; no murmurs, rubs or gallops;  PMI nondisplaced Abdomen -NT/ND, no HSM, no mass, + bowel sounds, no bruit 2+ femoral pulses, no bruits Ext-no edema, chords, 2+ DP Neuro-grossly nonfocal  ECG Sinus rhythm no ST changes.  Results for orders placed during the hospital encounter of 11/30/13 (from the past 48 hour(s))  CBC     Status: Abnormal   Collection Time    11/30/13  9:30 AM      Result Value Ref Range   WBC 17.1 (*) 4.0 - 10.5 K/uL   RBC 4.31  4.22 - 5.81 MIL/uL   Hemoglobin 13.7  13.0 - 17.0 g/dL   HCT 39.5  39.0 - 52.0 %   MCV 91.6  78.0 - 100.0 fL   MCH 31.8  26.0 - 34.0 pg   MCHC 34.7  30.0 - 36.0 g/dL   RDW 14.8  11.5 - 15.5 %   Platelets 223  150 - 400 K/uL  BASIC METABOLIC PANEL     Status: Abnormal   Collection Time    11/30/13  9:30 AM      Result Value Ref Range   Sodium  140  137 - 147 mEq/L   Potassium 4.0  3.7 - 5.3 mEq/L   Chloride 105  96 - 112 mEq/L   CO2 26  19 - 32 mEq/L   Glucose, Bld 91  70 - 99 mg/dL   BUN 6  6 - 23 mg/dL   Creatinine, Ser 0.92  0.50 - 1.35 mg/dL   Calcium 8.2 (*) 8.4 - 10.5 mg/dL   GFR calc non Af Amer >90  >90 mL/min   GFR calc Af Amer >90  >90 mL/min   Comment: (NOTE)     The eGFR has been calculated using the CKD EPI equation.     This calculation has not been validated in all clinical situations.     eGFR's persistently <90 mL/min signify possible Chronic Kidney     Disease.   Anion gap 9  5 - 15  TROPONIN I     Status: Abnormal   Collection Time    11/30/13  9:30  AM      Result Value Ref Range   Troponin I 0.44 (*) <0.30 ng/mL   Comment:            Due to the release kinetics of cTnI,     a negative result within the first hours     of the onset of symptoms does not rule out     myocardial infarction with certainty.     If myocardial infarction is still suspected,     repeat the test at appropriate intervals.     CRITICAL RESULT CALLED TO, READ BACK BY AND VERIFIED WITH:     N.KOHUT,RN [id number] 11/30/13 CLARK,S  I-STAT TROPOININ, ED     Status: Abnormal   Collection Time    11/30/13  9:49 AM      Result Value Ref Range   Troponin i, poc 0.21 (*) 0.00 - 0.08 ng/mL   Comment NOTIFIED PHYSICIAN     Comment 3            Comment: Due to the release kinetics of cTnI,     a negative result within the first hours     of the onset of symptoms does not rule out     myocardial infarction with certainty.     If myocardial infarction is still suspected,     repeat the test at appropriate intervals.    Dg Chest 2 View  11/30/2013   CLINICAL DATA:  Left chest pain  EXAM: CHEST  2 VIEW  COMPARISON:  11/22/2012  FINDINGS: The heart size and mediastinal contours are within normal limits. Both lungs are clear. The visualized skeletal structures are unremarkable.  IMPRESSION: No active cardiopulmonary disease.   Electronically Signed   By: Kathreen Devoid   On: 11/30/2013 10:07    Assessment/Plan 1 non-ST elevation myocardial infarction-the patient symptoms are concerning for ischemic cardiac pain. His troponin is mildly elevated. He also has a history of coronary disease with occluded distal LAD. He is presently pain-free. Will treat with aspirin, Plavix, statin and low-dose metoprolol. Add IV heparin. Proceed with cardiac catheterization. The risks and benefits were discussed and the patient agrees to proceed. Note the patient denies cocaine. Will check urine drug screen. 2 tobacco abuse-patient counseled on discontinuing. 3 leukocytosis-possibly secondary to stress  of infarct. Repeat CBC tomorrow morning.  Kirk Ruths MD 11/30/2013, 11:47 AM

## 2013-11-30 NOTE — Progress Notes (Signed)
Prior to transferring to cath lab, pt c/o 6/10 chest pain.  Cath lab notified and pt transported.

## 2014-01-31 ENCOUNTER — Encounter (HOSPITAL_COMMUNITY): Payer: Self-pay | Admitting: Cardiovascular Disease

## 2014-12-18 ENCOUNTER — Encounter (HOSPITAL_COMMUNITY): Payer: Self-pay | Admitting: Emergency Medicine

## 2014-12-18 ENCOUNTER — Emergency Department (HOSPITAL_COMMUNITY)
Admission: EM | Admit: 2014-12-18 | Discharge: 2014-12-18 | Disposition: A | Discharge status: Home / Self Care | Payer: Self-pay | Attending: Emergency Medicine | Admitting: Emergency Medicine

## 2014-12-18 DIAGNOSIS — I252 Old myocardial infarction: Secondary | ICD-10-CM | POA: Insufficient documentation

## 2014-12-18 DIAGNOSIS — Z8639 Personal history of other endocrine, nutritional and metabolic disease: Secondary | ICD-10-CM | POA: Insufficient documentation

## 2014-12-18 DIAGNOSIS — Z9889 Other specified postprocedural states: Secondary | ICD-10-CM | POA: Insufficient documentation

## 2014-12-18 DIAGNOSIS — H6121 Impacted cerumen, right ear: Secondary | ICD-10-CM | POA: Insufficient documentation

## 2014-12-18 DIAGNOSIS — F17210 Nicotine dependence, cigarettes, uncomplicated: Secondary | ICD-10-CM | POA: Insufficient documentation

## 2014-12-18 DIAGNOSIS — I2510 Atherosclerotic heart disease of native coronary artery without angina pectoris: Secondary | ICD-10-CM | POA: Insufficient documentation

## 2014-12-18 NOTE — ED Provider Notes (Signed)
CSN: [id number]     Arrival date & time 12/18/14  2130 History  By signing my name below, I, Lyndel SafeKaitlyn Shelton, attest that this documentation has been prepared under the direction and in the presence of Teressa LowerVrinda Alyssandra Hulsebus, NP. Electronically Signed: Lyndel SafeKaitlyn Shelton, ED Scribe. 12/18/2014. 9:52 PM.    Chief Complaint  Patient presents with  . Otalgia   The history is provided by the patient. No language interpreter was used.   HPI Comments: Phillip [name] is a 34 y.o. male, with a PMhx of CAD, HLD, and MI, who presents to the Emergency Department complaining of gradually worsening decreased hearing in right ear onset 7 days ago with more recent onset of associated otalgia that he attributes to constant touching of his right ear. He endorses a URI 2 weeks ago that has resolved. He has been using Debrox without relief. Pt denies drainage form the ear, inserting anything into his ear, or fevers.   Past Medical History  Diagnosis Date  . Past heart attack   . Coronary artery disease   . Hyperlipidemia   . Elevated troponin    Past Surgical History  Procedure Laterality Date  . No previous surgeries    . Left heart catheterization with coronary angiogram N/A 11/30/2013    Procedure: LEFT HEART CATHETERIZATION WITH CORONARY ANGIOGRAM;  Surgeon: Kathleene Hazelhristopher D McAlhany, MD;  [facility];  Service: Cardiovascular;  Laterality: N/A;   Family History  Problem Relation Age of Onset  . Heart disease Mother     CHF   Social History  Substance Use Topics  . Smoking status: Current Every Day Smoker -- 0.25 packs/day    Types: Cigarettes  . Smokeless tobacco: None  . Alcohol Use: Yes     Comment: Occasional    Review of Systems  Constitutional: Negative for fever.  HENT: Positive for ear pain and hearing loss. Negative for ear discharge.   All other systems reviewed and are negative.  Allergies  Review of patient's allergies indicates no known allergies.  Home Medications   Prior  to Admission medications   Medication Sig Start Date End Date Taking? Authorizing Provider  ibuprofen (ADVIL,MOTRIN) 200 MG tablet Take 800 mg by mouth every 6 (six) hours as needed (pain).     Historical Provider, MD   BP 113/77 mmHg  Pulse 71  Temp(Src) 97.9 F (36.6 C) (Oral)  Resp 16  Ht 5\' 5"  (1.651 m)  Wt 120 lb (54.432 kg)  BMI 19.97 kg/m2  SpO2 100% Physical Exam  Constitutional: He is oriented to person, place, and time. He appears well-developed and well-nourished. No distress.  HENT:  Head: Normocephalic and atraumatic.  Left Ear: External ear normal.  r cerumen impaction  Eyes: Conjunctivae are normal. Pupils are equal, round, and reactive to light.  Neck: Normal range of motion. Neck supple.  Cardiovascular: Normal rate.   Pulmonary/Chest: Effort normal. No respiratory distress.  Musculoskeletal: Normal range of motion.  Neurological: He is alert and oriented to person, place, and time. Coordination normal.  Skin: Skin is warm and dry.  Psychiatric: He has a normal mood and affect. His behavior is normal.  Nursing note and vitals reviewed.   ED Course  .Ear Cerumen Removal Date/Time: 12/18/2014 10:25 PM Performed by: Teressa LowerPICKERING, Pearlene Teat Authorized by: Teressa LowerPICKERING, Ami Mally Consent: Verbal consent obtained. Risks and benefits: risks, benefits and alternatives were discussed Consent given by: patient Patient identity confirmed: verbally with patient Local anesthetic: none Location details: right ear Procedure type: irrigation Patient sedated:  no Patient tolerance: Patient tolerated the procedure well with no immediate complications    DIAGNOSTIC STUDIES: Oxygen Saturation is 100% on RA, normal by my interpretation.    COORDINATION OF CARE: 9:52 PM Discussed treatment plan with pt at bedside and pt agreed to plan.  MDM   Final diagnoses:  Cerumen impaction, right    Pt is feeling better after the irrigation and is hearing better  I personally performed  the services described in this documentation, which was scribed in my presence. The recorded information has been reviewed and is accurate.    Teressa Lower, NP 12/18/14 2226  Cathren Laine, MD 12/18/14 2236

## 2014-12-18 NOTE — ED Notes (Signed)
Pt. reports right ear ache with mild hearing loss onset last week , denies injury , no drainage or fever .

## 2014-12-18 NOTE — Discharge Instructions (Signed)
Cerumen Impaction The structures of the external ear canal secrete a waxy substance known as cerumen. Excess cerumen can build up in the ear canal, causing a condition known as cerumen impaction. Cerumen impaction can cause ear pain and disrupt the function of the ear. The rate of cerumen production differs for each individual. In certain individuals, the configuration of the ear canal may decrease his or her ability to naturally remove cerumen. CAUSES Cerumen impaction is caused by excessive cerumen production or buildup. RISK FACTORS  Frequent use of swabs to clean ears.  Having narrow ear canals.  Having eczema.  Being dehydrated. SIGNS AND SYMPTOMS  Diminished hearing.  Ear drainage.  Ear pain.  Ear itch. TREATMENT Treatment may involve:  Over-the-counter or prescription ear drops to soften the cerumen.  Removal of cerumen by a health care provider. This may be done with:  Irrigation with warm water. This is the most common method of removal.  Ear curettes and other instruments.  Surgery. This may be done in severe cases. HOME CARE INSTRUCTIONS  Take medicines only as directed by your health care provider.  Do not insert objects into the ear with the intent of cleaning the ear. PREVENTION  Do not insert objects into the ear, even with the intent of cleaning the ear. Removing cerumen as a part of normal hygiene is not necessary, and the use of swabs in the ear canal is not recommended.  Drink enough water to keep your urine clear or pale yellow.  Control your eczema if you have it. SEEK MEDICAL CARE IF:  You develop ear pain.  You develop bleeding from the ear.  The cerumen does not clear after you use ear drops as directed.   This information is not intended to replace advice given to you by your health care provider. Make sure you discuss any questions you have with your health care provider.   Document Released: 03/18/2004 Document Revised: 03/01/2014  Document Reviewed: 09/25/2014 Elsevier Interactive Patient Education 2016 Elsevier Inc.  

## 2015-01-24 ENCOUNTER — Emergency Department (HOSPITAL_COMMUNITY)
Admission: EM | Admit: 2015-01-24 | Discharge: 2015-01-24 | Disposition: A | Discharge status: Home / Self Care | Payer: Self-pay | Attending: Emergency Medicine | Admitting: Emergency Medicine

## 2015-01-24 ENCOUNTER — Encounter (HOSPITAL_COMMUNITY): Payer: Self-pay | Admitting: *Deleted

## 2015-01-24 DIAGNOSIS — K610 Anal abscess: Secondary | ICD-10-CM | POA: Insufficient documentation

## 2015-01-24 DIAGNOSIS — I252 Old myocardial infarction: Secondary | ICD-10-CM | POA: Insufficient documentation

## 2015-01-24 DIAGNOSIS — Z7982 Long term (current) use of aspirin: Secondary | ICD-10-CM | POA: Insufficient documentation

## 2015-01-24 DIAGNOSIS — Z8639 Personal history of other endocrine, nutritional and metabolic disease: Secondary | ICD-10-CM | POA: Insufficient documentation

## 2015-01-24 DIAGNOSIS — F17210 Nicotine dependence, cigarettes, uncomplicated: Secondary | ICD-10-CM | POA: Insufficient documentation

## 2015-01-24 DIAGNOSIS — I2510 Atherosclerotic heart disease of native coronary artery without angina pectoris: Secondary | ICD-10-CM | POA: Insufficient documentation

## 2015-01-24 MED ORDER — FENTANYL CITRATE (PF) 100 MCG/2ML IJ SOLN
50.0000 ug | Freq: Once | INTRAMUSCULAR | Status: AC
  Administered 2015-01-24: 50 ug via INTRAMUSCULAR
  Filled 2015-01-24: qty 2

## 2015-01-24 MED ORDER — LIDOCAINE HCL (PF) 1 % IJ SOLN
5.0000 mL | Freq: Once | INTRAMUSCULAR | Status: AC
  Administered 2015-01-24: 5 mL via INTRADERMAL
  Filled 2015-01-24 (×2): qty 5

## 2015-01-24 MED ORDER — CEPHALEXIN 500 MG PO CAPS: 500.0000 mg | ORAL_CAPSULE | Freq: Four times a day (QID) | ORAL | Status: DC

## 2015-01-24 NOTE — Discharge Instructions (Signed)
Perianal Abscess An abscess is an infected area that contains a collection of pus and debris. A perianal abscess is one that occurs in the perineal area, which is the area between the anus and the scrotum in males and between the anus and the vagina in females. Perianal abscesses can vary in size. Without treatment, a perianal abscess can become larger and cause other problems. CAUSES  Glands in the perineal area can become plugged up with debris. When this happens, an abscess may form.  SIGNS AND SYMPTOMS  The most common symptoms of a perianal abscess are:  Swelling and redness in the area of the abscess. The redness may go beyond the abscess and appear as a red streak on the skin.  Pain in the area of the abscess. Other possible symptoms include:   A visible lump or a lump that can be felt when touching the area and is usually painful.  Bleeding or pus-like discharge from the area.  Fever.  General weakness. DIAGNOSIS  Your health care provider will take a medical history and examine the area. This may involve examining the rectal area with a gloved hand (digital rectal exam). For women, it may require a careful vaginal exam. Sometimes, the health care provider needs to look into the rectum using a probe or scope. TREATMENT  Treatment often requires making a cut (incision) in the abscess to drain the pus. This can sometimes be done in your health care provider's office or an emergency department after giving you medicine to numb the area (local anesthetic). For larger or deeper abscesses, surgery may be required to drain the abscess. Antibiotic medicines are sometimes given if there is infection of the surrounding tissue (cellulitis). In some cases, gauze is packed into the abscess to continue draining the area. Frequent sitz baths may be recommended to help the wound heal and to reduce the chance of the abscess coming back. HOME CARE INSTRUCTIONS   Only take over-the-counter or  prescription medicines for pain, fever, or discomfort as directed by your health care provider.  Take antibiotic medicine as directed. Make sure you finish it even if you start to feel better.  If gauze is used in the abscess, follow your health care provider's instructions for removing or changing the gauze. It can usually be removed in 2-3 days.  If one or more drains have been placed in the abscess cavity, be careful not to pull at them. Your health care provider will tell you how long they need to remain in place.  Take warm sitz baths 3-4 times a day and after bowel movements. This will help reduce pain and swelling.  Keep the skin around the abscess clean and dry. Avoid cleaning the area too much.  Avoid scratching the abscess area.  Avoid using colored or perfumed toilet papers. SEEK MEDICAL CARE IF:   You have trouble having a bowel movement or passing urine.  Your pain or swelling in the affected area does not seem to be improving.  The gauze packing or the drains come out before the planned time. SEEK IMMEDIATE MEDICAL CARE IF:   You have problems moving or using your legs.  You have severe or increasing pain.  Your swelling in the affected area suddenly gets worse.  You have a large increase in bleeding or passing of pus.  You have chills or a fever. MAKE SURE YOU:   Understand these instructions.  Will watch your condition.  Will get help right away if you are   not doing well or get worse.   This information is not intended to replace advice given to you by your health care provider. Make sure you discuss any questions you have with your health care provider.   Document Released: 03/17/2006 Document Revised: 11/29/2012 Document Reviewed: 09/20/2012 Elsevier Interactive Patient Education 2016 Elsevier Inc.  

## 2015-01-24 NOTE — ED Notes (Signed)
MD at bedside. 

## 2015-01-24 NOTE — ED Notes (Signed)
Pt presents via POV c/o rectal pain, states he has hemorrhoids.  Started x 2 days ago, tried ibuprofen and preparation H without relief.  Pt a x 4, lying on stomach crying.  Answers questions appropriately.

## 2015-01-24 NOTE — ED Provider Notes (Signed)
CSN: [id number]     Arrival date & time 01/24/15  1017 History   First MD Initiated Contact with Patient 01/24/15 1025     Chief Complaint  Patient presents with  . Hemorrhoids   Patient is a 34 y.o. male presenting with male genitourinary complaint. The history is provided by the patient.  Male GU Problem Presenting symptoms: no dysuria and no penile pain   Presenting symptoms comment:  Perirectal pain Context: spontaneously   Relieved by:  Nothing Ineffective treatments: hemorrhoid cream, sitz baths. Associated symptoms: no abdominal pain, no diarrhea, no fever, no genital lesions, no groin pain, no hematuria, no nausea, no urinary incontinence and no vomiting   Risk factors: no HIV, does not have multiple sexual partners, no recent infection and no STD exposure     Past Medical History  Diagnosis Date  . Past heart attack   . Coronary artery disease   . Hyperlipidemia   . Elevated troponin    Past Surgical History  Procedure Laterality Date  . No previous surgeries    . Left heart catheterization with coronary angiogram N/A 11/30/2013    Procedure: LEFT HEART CATHETERIZATION WITH CORONARY ANGIOGRAM;  Surgeon: Kathleene Hazel, MD;  Location: [facility] CATH LAB;  Service: Cardiovascular;  Laterality: N/A;   Family History  Problem Relation Age of Onset  . Heart disease Mother     CHF   Social History  Substance Use Topics  . Smoking status: Current Every Day Smoker -- 0.25 packs/day    Types: Cigarettes  . Smokeless tobacco: None  . Alcohol Use: Yes     Comment: Occasional   Review of Systems  Constitutional: Negative for fever and chills.  HENT: Negative for rhinorrhea and sore throat.   Eyes: Negative for visual disturbance.  Respiratory: Negative for cough and shortness of breath.   Cardiovascular: Negative for chest pain.  Gastrointestinal: Positive for rectal pain. Negative for nausea, vomiting, abdominal pain, diarrhea, constipation, blood in stool and anal  bleeding.  Genitourinary: Negative for bladder incontinence, dysuria, hematuria and penile pain.  Musculoskeletal: Negative for back pain and neck pain.  Skin: Negative for rash.  Neurological: Negative for syncope and headaches.  Psychiatric/Behavioral: Negative for confusion.  All other systems reviewed and are negative.  Allergies  Review of patient's allergies indicates no known allergies.  Home Medications   Prior to Admission medications   Medication Sig Start Date End Date Taking? Authorizing Provider  aspirin 325 MG tablet Take 325 mg by mouth every 4 (four) hours as needed for mild pain.   Yes Historical Provider, MD  ibuprofen (ADVIL,MOTRIN) 200 MG tablet Take 800 mg by mouth every 6 (six) hours as needed (pain).    Yes Historical Provider, MD  cephALEXin (KEFLEX) 500 MG capsule Take 1 capsule (500 mg total) by mouth 4 (four) times daily. 01/24/15   Maris Berger, MD   BP 133/87 mmHg  Pulse 90  Temp(Src) 98.8 F (37.1 C) (Oral)  Resp 18  Ht  (1.651 m)  Wt 56.7 kg  BMI 20.80 kg/m2  SpO2 100% Physical Exam  Constitutional: He is oriented to person, place, and time. He appears well-developed and well-nourished. No distress.  HENT:  Head: Normocephalic and atraumatic.  Mouth/Throat: Oropharynx is clear and moist.  Eyes: EOM are normal.  Neck: Neck supple. No JVD present.  Cardiovascular: Normal rate, regular rhythm, normal heart sounds and intact distal pulses.   Pulmonary/Chest: Effort normal and breath sounds normal.  Abdominal: Soft. He exhibits  no distension. There is no tenderness.  Genitourinary:  1 cm x 1 cm perianal tender, fluctuant mass. DRE negative for anterior or posterior rectal tenderness or fluctuance. No gross blood. No thrombosed hemorrhoids.  Musculoskeletal: Normal range of motion. He exhibits no edema.  Neurological: He is alert and oriented to person, place, and time. No cranial nerve deficit.  Skin: Skin is warm and dry.  Psychiatric: His  behavior is normal.    ED Course  .Marland Kitchen.Incision and Drainage Date/Time: 01/24/2015 11:00 AM Performed by: Maris BergerGUNALDA, Amarionna Arca Authorized by: Maris BergerGUNALDA, Janiece Scovill Consent: Verbal consent obtained. Type: abscess Body area: anogenital Location details: perianal Anesthesia: local infiltration Local anesthetic: lidocaine 1% without epinephrine Patient sedated: no Needle gauge: 18 Complexity: simple Drainage: purulent Drainage amount: copious Patient tolerance: Patient tolerated the procedure well with no immediate complications    MDM   Final diagnoses:  Perianal abscess    34 yo african american immunocompetent AAM presents with perianal pain. H/o hemorrhoids. Exam more consistent with perianal abscess. AF, VSS. No systemic symptoms. Fluctuant, tender, 1 x 1 cm. No anterior palpable fluctuance on DRE, no posterior rectal fluctuance. I&D as above with copious purulence. Patient immediately had symptomatic relief. Will d/c with keflex given location of abscess. I provided verbal discharge instructions including follow up and return precautions. Pt agreeable with plan.  Discussed with Dr. Clydene PughKnott.  Maris BergerJonah Lorraine Terriquez, MD 01/24/15 [id number]  Lyndal Pulleyaniel Knott, MD 01/26/15 2005

## 2015-05-10 ENCOUNTER — Emergency Department (HOSPITAL_COMMUNITY): Payer: Self-pay

## 2015-05-10 ENCOUNTER — Encounter (HOSPITAL_COMMUNITY): Payer: Self-pay | Admitting: Family Medicine

## 2015-05-10 ENCOUNTER — Emergency Department (HOSPITAL_COMMUNITY)
Admission: EM | Admit: 2015-05-10 | Discharge: 2015-05-10 | Disposition: A | Discharge status: Home / Self Care | Payer: Self-pay | Attending: Emergency Medicine | Admitting: Emergency Medicine

## 2015-05-10 DIAGNOSIS — R0789 Other chest pain: Secondary | ICD-10-CM | POA: Insufficient documentation

## 2015-05-10 DIAGNOSIS — F1210 Cannabis abuse, uncomplicated: Secondary | ICD-10-CM | POA: Insufficient documentation

## 2015-05-10 DIAGNOSIS — I2510 Atherosclerotic heart disease of native coronary artery without angina pectoris: Secondary | ICD-10-CM | POA: Insufficient documentation

## 2015-05-10 DIAGNOSIS — Z8639 Personal history of other endocrine, nutritional and metabolic disease: Secondary | ICD-10-CM | POA: Insufficient documentation

## 2015-05-10 DIAGNOSIS — F17210 Nicotine dependence, cigarettes, uncomplicated: Secondary | ICD-10-CM | POA: Insufficient documentation

## 2015-05-10 DIAGNOSIS — Z792 Long term (current) use of antibiotics: Secondary | ICD-10-CM | POA: Insufficient documentation

## 2015-05-10 DIAGNOSIS — I201 Angina pectoris with documented spasm: Secondary | ICD-10-CM

## 2015-05-10 DIAGNOSIS — R072 Precordial pain: Secondary | ICD-10-CM

## 2015-05-10 HISTORY — DX: Old myocardial infarction: I25.2

## 2015-05-10 HISTORY — DX: Angina pectoris with documented spasm: I20.1

## 2015-05-10 LAB — I-STAT TROPONIN, ED
TROPONIN I, POC: 0 ng/mL (ref 0.00–0.08)
TROPONIN I, POC: 0.07 ng/mL (ref 0.00–0.08)

## 2015-05-10 LAB — RAPID URINE DRUG SCREEN, HOSP PERFORMED
AMPHETAMINES: NOT DETECTED
BENZODIAZEPINES: NOT DETECTED
Barbiturates: NOT DETECTED
Cocaine: NOT DETECTED
OPIATES: NOT DETECTED
Tetrahydrocannabinol: POSITIVE — AB

## 2015-05-10 LAB — BASIC METABOLIC PANEL
ANION GAP: 11 (ref 5–15)
BUN: 5 mg/dL — ABNORMAL LOW (ref 6–20)
CALCIUM: 8.2 mg/dL — AB (ref 8.9–10.3)
CO2: 23 mmol/L (ref 22–32)
Chloride: 109 mmol/L (ref 101–111)
Creatinine, Ser: 1.13 mg/dL (ref 0.61–1.24)
GLUCOSE: 91 mg/dL (ref 65–99)
POTASSIUM: 3.8 mmol/L (ref 3.5–5.1)
SODIUM: 143 mmol/L (ref 135–145)

## 2015-05-10 LAB — CBC
HEMATOCRIT: 43.4 % (ref 39.0–52.0)
HEMOGLOBIN: 14.6 g/dL (ref 13.0–17.0)
MCH: 31.5 pg (ref 26.0–34.0)
MCHC: 33.6 g/dL (ref 30.0–36.0)
MCV: 93.5 fL (ref 78.0–100.0)
Platelets: 189 10*3/uL (ref 150–400)
RBC: 4.64 MIL/uL (ref 4.22–5.81)
RDW: 14.4 % (ref 11.5–15.5)
WBC: 10.9 10*3/uL — AB (ref 4.0–10.5)

## 2015-05-10 LAB — SEDIMENTATION RATE: SED RATE: 2 mm/h (ref 0–16)

## 2015-05-10 LAB — D-DIMER, QUANTITATIVE (NOT AT ARMC)

## 2015-05-10 MED ORDER — SODIUM CHLORIDE 0.9 % IV BOLUS (SEPSIS)
1000.0000 mL | Freq: Once | INTRAVENOUS | Status: AC
  Administered 2015-05-10: 1000 mL via INTRAVENOUS

## 2015-05-10 MED ORDER — KETOROLAC TROMETHAMINE 30 MG/ML IJ SOLN
30.0000 mg | Freq: Once | INTRAMUSCULAR | Status: AC
  Administered 2015-05-10: 30 mg via INTRAVENOUS
  Filled 2015-05-10: qty 1

## 2015-05-10 MED ORDER — AMLODIPINE BESYLATE 2.5 MG PO TABS: 2.5000 mg | ORAL_TABLET | Freq: Every day | ORAL | Status: DC

## 2015-05-10 MED ORDER — NICOTINE 7 MG/24HR TD PT24: 7.0000 mg | MEDICATED_PATCH | Freq: Every day | TRANSDERMAL | Status: DC

## 2015-05-10 MED ORDER — NICOTINE 14 MG/24HR TD PT24: 14.0000 mg | MEDICATED_PATCH | Freq: Every day | TRANSDERMAL | Status: DC

## 2015-05-10 MED ORDER — ASPIRIN 81 MG PO CHEW: 81.0000 mg | CHEWABLE_TABLET | Freq: Once | ORAL | Status: DC

## 2015-05-10 MED ORDER — ACETAMINOPHEN 325 MG PO TABS
650.0000 mg | ORAL_TABLET | Freq: Once | ORAL | Status: AC
  Administered 2015-05-10: 650 mg via ORAL
  Filled 2015-05-10: qty 2

## 2015-05-10 MED ORDER — NITROGLYCERIN 0.4 MG SL SUBL
0.4000 mg | SUBLINGUAL_TABLET | SUBLINGUAL | Status: DC | PRN
Start: 2015-05-10 — End: 2015-05-10
  Administered 2015-05-10: 0.4 mg via SUBLINGUAL
  Filled 2015-05-10: qty 1

## 2015-05-10 MED ORDER — ASPIRIN 81 MG PO CHEW: 81.0000 mg | CHEWABLE_TABLET | Freq: Every day | ORAL | Status: DC

## 2015-05-10 NOTE — Discharge Instructions (Signed)
Nonspecific Chest Pain  °Chest pain can be caused by many different conditions. There is always a chance that your pain could be related to something serious, such as a heart attack or a blood clot in your lungs. Chest pain can also be caused by conditions that are not life-threatening. If you have chest pain, it is very important to follow up with your health care provider. °CAUSES  °Chest pain can be caused by: °· Heartburn. °· Pneumonia or bronchitis. °· Anxiety or stress. °· Inflammation around your heart (pericarditis) or lung (pleuritis or pleurisy). °· A blood clot in your lung. °· A collapsed lung (pneumothorax). It can develop suddenly on its own (spontaneous pneumothorax) or from trauma to the chest. °· Shingles infection (varicella-zoster virus). °· Heart attack. °· Damage to the bones, muscles, and cartilage that make up your chest wall. This can include: °¨ Bruised bones due to injury. °¨ Strained muscles or cartilage due to frequent or repeated coughing or overwork. °¨ Fracture to one or more ribs. °¨ Sore cartilage due to inflammation (costochondritis). °RISK FACTORS  °Risk factors for chest pain may include: °· Activities that increase your risk for trauma or injury to your chest. °· Respiratory infections or conditions that cause frequent coughing. °· Medical conditions or overeating that can cause heartburn. °· Heart disease or family history of heart disease. °· Conditions or health behaviors that increase your risk of developing a blood clot. °· Having had chicken pox (varicella zoster). °SIGNS AND SYMPTOMS °Chest pain can feel like: °· Burning or tingling on the surface of your chest or deep in your chest. °· Crushing, pressure, aching, or squeezing pain. °· Dull or sharp pain that is worse when you move, cough, or take a deep breath. °· Pain that is also felt in your back, neck, shoulder, or arm, or pain that spreads to any of these areas. °Your chest pain may come and go, or it may stay  constant. °DIAGNOSIS °Lab tests or other studies may be needed to find the cause of your pain. Your health care provider may have you take a test called an ambulatory ECG (electrocardiogram). An ECG records your heartbeat patterns at the time the test is performed. You may also have other tests, such as: °· Transthoracic echocardiogram (TTE). During echocardiography, sound waves are used to create a picture of all of the heart structures and to look at how blood flows through your heart. °· Transesophageal echocardiogram (TEE). This is a more advanced imaging test that obtains images from inside your body. It allows your health care provider to see your heart in finer detail. °· Cardiac monitoring. This allows your health care provider to monitor your heart rate and rhythm in real time. °· Holter monitor. This is a portable device that records your heartbeat and can help to diagnose abnormal heartbeats. It allows your health care provider to track your heart activity for several days, if needed. °· Stress tests. These can be done through exercise or by taking medicine that makes your heart beat more quickly. °· Blood tests. °· Imaging tests. °TREATMENT  °Your treatment depends on what is causing your chest pain. Treatment may include: °· Medicines. These may include: °¨ Acid blockers for heartburn. °¨ Anti-inflammatory medicine. °¨ Pain medicine for inflammatory conditions. °¨ Antibiotic medicine, if an infection is present. °¨ Medicines to dissolve blood clots. °¨ Medicines to treat coronary artery disease. °· Supportive care for conditions that do not require medicines. This may include: °¨ Resting. °¨ Applying heat   or cold packs to injured areas. °¨ Limiting activities until pain decreases. °HOME CARE INSTRUCTIONS °· If you were prescribed an antibiotic medicine, finish it all even if you start to feel better. °· Avoid any activities that bring on chest pain. °· Do not use any tobacco products, including  cigarettes, chewing tobacco, or electronic cigarettes. If you need help quitting, ask your health care provider. °· Do not drink alcohol. °· Take medicines only as directed by your health care provider. °· Keep all follow-up visits as directed by your health care provider. This is important. This includes any further testing if your chest pain does not go away. °· If heartburn is the cause for your chest pain, you may be told to keep your head raised (elevated) while sleeping. This reduces the chance that acid will go from your stomach into your esophagus. °· Make lifestyle changes as directed by your health care provider. These may include: °¨ Getting regular exercise. Ask your health care provider to suggest some activities that are safe for you. °¨ Eating a heart-healthy diet. A registered dietitian can help you to learn healthy eating options. °¨ Maintaining a healthy weight. °¨ Managing diabetes, if necessary. °¨ Reducing stress. °SEEK MEDICAL CARE IF: °· Your chest pain does not go away after treatment. °· You have a rash with blisters on your chest. °· You have a fever. °SEEK IMMEDIATE MEDICAL CARE IF:  °· Your chest pain is worse. °· You have an increasing cough, or you cough up blood. °· You have severe abdominal pain. °· You have severe weakness. °· You faint. °· You have chills. °· You have sudden, unexplained chest discomfort. °· You have sudden, unexplained discomfort in your arms, back, neck, or jaw. °· You have shortness of breath at any time. °· You suddenly start to sweat, or your skin gets clammy. °· You feel nauseous or you vomit. °· You suddenly feel light-headed or dizzy. °· Your heart begins to beat quickly, or it feels like it is skipping beats. °These symptoms may represent a serious problem that is an emergency. Do not wait to see if the symptoms will go away. Get medical help right away. Call your local emergency services (911 in the [place] Do not drive yourself to the hospital. °  °This  information is not intended to replace advice given to you by your health care provider. Make sure you discuss any questions you have with your health care provider. °  °Document Released: 11/18/2004 Document Revised: 03/01/2014 Document Reviewed: 09/14/2013 °Elsevier Interactive Patient Education ©2016 [employer] ° °

## 2015-05-10 NOTE — ED Notes (Signed)
EKG unremarkable with EMS and vitals, 124/88, 78, RR 16

## 2015-05-10 NOTE — ED Provider Notes (Signed)
CSN: [id number]     Arrival date & time 05/10/15  1034 History   First MD Initiated Contact with Patient 05/10/15 1051     Chief Complaint  Patient presents with  . Chest Pain     (Consider location/radiation/quality/duration/timing/severity/associated sxs/prior Treatment) HPI  35 year old male presents with chest pain since waking up at 9:15 AM. Patient states he woke up very quickly because he is worried he is going to be late to an appointment. Patient states it feels like he has a lump in his throat but in his chest. No shortness of breath. Had some transient left arm pain but that is gone. This feels similar to when he was seen in October 2015 and had an NSTEMI but normal coronaries on cath. Patient was given 4 baby aspirin and one nitroglycerin with no change in his pain. Anus 8/10. No nausea, vomiting, or diaphoresis. Denies cocaine or other drug use.  Past Medical History  Diagnosis Date  . Past heart attack   . Coronary artery disease   . Hyperlipidemia   . Elevated troponin    Past Surgical History  Procedure Laterality Date  . No previous surgeries    . Left heart catheterization with coronary angiogram N/A 11/30/2013    Procedure: LEFT HEART CATHETERIZATION WITH CORONARY ANGIOGRAM;  Surgeon: Burnell Blanks, MD;  Location: [facility] CATH LAB;  Service: Cardiovascular;  Laterality: N/A;   Family History  Problem Relation Age of Onset  . Heart disease Mother     CHF   Social History  Substance Use Topics  . Smoking status: Current Every Day Smoker -- 0.25 packs/day    Types: Cigarettes  . Smokeless tobacco: None  . Alcohol Use: Yes     Comment: Occasional    Review of Systems  Constitutional: Negative for fever and diaphoresis.  Respiratory: Negative for shortness of breath.   Cardiovascular: Positive for chest pain.  Gastrointestinal: Negative for nausea and vomiting.  Musculoskeletal: Negative for back pain.  All other systems reviewed and are  negative.     Allergies  Review of patient's allergies indicates no known allergies.  Home Medications   Prior to Admission medications   Medication Sig Start Date End Date Taking? Authorizing Provider  aspirin 325 MG tablet Take 325 mg by mouth every 4 (four) hours as needed for mild pain.    Historical Provider, MD  cephALEXin (KEFLEX) 500 MG capsule Take 1 capsule (500 mg total) by mouth 4 (four) times daily. 01/24/15   Gustavus Bryant, MD  ibuprofen (ADVIL,MOTRIN) 200 MG tablet Take 800 mg by mouth every 6 (six) hours as needed (pain).     Historical Provider, MD   BP 112/83 mmHg  Pulse 65  Resp 14  SpO2 100% Physical Exam  Constitutional: He is oriented to person, place, and time. He appears well-developed and well-nourished.  HENT:  Head: Normocephalic and atraumatic.  Right Ear: External ear normal.  Left Ear: External ear normal.  Nose: Nose normal.  Eyes: Right eye exhibits no discharge. Left eye exhibits no discharge.  Neck: Neck supple.  Cardiovascular: Normal rate, regular rhythm, normal heart sounds and intact distal pulses.   Pulses:      Radial pulses are 2+ on the right side, and 2+ on the left side.  Pulmonary/Chest: Effort normal and breath sounds normal. He exhibits no tenderness.  Abdominal: Soft. He exhibits no distension. There is no tenderness.  Musculoskeletal: He exhibits no edema.  Neurological: He is alert and oriented to person, place,  and time.  Skin: Skin is warm and dry.  Nursing note and vitals reviewed.   ED Course  Procedures (including critical care time) Labs Review Labs Reviewed  BASIC METABOLIC PANEL - Abnormal; Notable for the following:    BUN 5 (*)    Calcium 8.2 (*)    All other components within normal limits  CBC - Abnormal; Notable for the following:    WBC 10.9 (*)    All other components within normal limits  URINE RAPID DRUG SCREEN, HOSP PERFORMED - Abnormal; Notable for the following:    Tetrahydrocannabinol POSITIVE  (*)    All other components within normal limits  D-DIMER, QUANTITATIVE (NOT AT [facility])  SEDIMENTATION RATE  I-STAT TROPOININ, ED  Randolm Idol, ED    Imaging Review Dg Chest 2 View  05/10/2015  CLINICAL DATA:  Chest pain EXAM: CHEST  2 VIEW COMPARISON:  November 30, 2013 FINDINGS: The heart size and mediastinal contours are within normal limits. Both lungs are clear. The visualized skeletal structures are unremarkable. IMPRESSION: No active cardiopulmonary disease. Electronically Signed   By: Dorise Bullion III M.D   On: 05/10/2015 11:53   I have personally reviewed and evaluated these images and lab results as part of my medical decision-making.   EKG Interpretation   Date/Time:  Saturday May 10 2015 10:40:31 EDT Ventricular Rate:  63 PR Interval:  142 QRS Duration: 87 QT Interval:  386 QTC Calculation: 395 R Axis:   -53 Text Interpretation:  Sinus rhythm LAD, consider left anterior fascicular  block RSR' in V1 or V2, right VCD or RVH Borderline T abnormalities,  inferior leads Inferior T wave changes new compared to 2015 Confirmed by  Yaden Seith  MD, Jenavieve Freda ([id number]) on 05/10/2015 10:50:07 AM       EKG Interpretation  Date/Time:  Saturday May 10 2015 12:01:59 EDT Ventricular Rate:  55 PR Interval:  144 QRS Duration: 122 QT Interval:  396 QTC Calculation: 379 R Axis:   10 Text Interpretation:  Sinus rhythm Nonspecific intraventricular conduction delay Borderline T abnormalities, inferior leads no significant change since earlier in the day Confirmed by Kaidence Sant  MD, Youcef Klas ([id number]) on 05/10/2015 12:04:51 PM       MDM   Final diagnoses:  Atypical chest pain    Patient's CP is atypical. Nitro did not help much. Toradol took pain from 8 to a 4. Similar to pain in 2015. At that time his cath showed clean coronaries. Some T wave changes inferiorly not present on prior ECG. Given this ddimer sent for low risk PE, and is negative. Pain improved, second troponin at 0.07 (still  negative by lab standards). Given this rise but still no MI, cards has been consulted. They think this is likely spasm, will send Rx to his pharmacy. Want to wait on ESR. If negative or minimally elevated, d/c home. If significantly elevated, call back, they will re-eval for possible pericardial pathology. Care to Dr. Wilson Singer.     Sherwood Gambler, MD 05/10/15 [phone]

## 2015-05-10 NOTE — Consult Note (Signed)
Consultation Note   Patient ID: Phillip [name], MRN: [id number], DOB: 31-Jul-1980   Date of Encounter: 05/10/2015, 4:35 PM  Primary Care Provider: Default, Provider, MD Cardiologist: Dr. Olga Millers  Electrophysiologist:  n/a  Chief Complaint:   Chest pain  History of Present Illness: Phillip [name] is a 35 y.o. male with a history of tobacco abuse, non-STEMI in 2008. LHC demonstrated ostial RCA spasm and small 100% occlusion at the tip of the LAD. EF was preserved. Patient was treated medically. He was admitted again in 10/15 with non-STEMI. Troponin peaked at 5.59. Cardiac catheterization demonstrated normal coronary arteries. Treatment for coronary vasospasm was recommended. Patient left AMA during that admission.    He now presents with chest pain that started up this morning when he awakened. He was worried that he would be late to her appointment today. He had some transient left arm pain that is now resolved. He says this symptom is similar to the symptoms he had in 2015 when he was found to have NSTEMI, however no significant coronary obstruction. There is been concern about possible coronary artery spasm versus a pericarditis. Phillip [name] is a smoker and smokes about 4 many cigars every day. He also uses marijuana but denies any cocaine abuse.  Troponin neg x 2.  DDimer < 0.27.  UDS + THC.  CXR with NAD.  ECG without acute changes.    Prior Cardiac Studies LHC 11/30/13 LM normal LAD patent LCx patent RCA patent EF 50%  Echo 10/15 Septal and inferobasal HK, EF 50-55%  LHC 12/08 in the setting of inferior STEMI RCA ostial spasm LM okay LCx okay LAD patent except for occlusion of the apical LAD EF 50-55% with apical HK   Past Medical History  Diagnosis Date  . History of acute inferior wall MI     a. LHC 12/08 - oRCA spasm; apical LAD occluded  . Coronary vasospasm (HCC)     a. NSTEMI 10/15 - LHC: no sig CAD;  b. Echo 10/15: EF 50-55%, inf HK  . Hyperlipidemia      Past  Surgical History  Procedure Laterality Date  . No previous surgeries    . Left heart catheterization with coronary angiogram N/A 11/30/2013    Procedure: LEFT HEART CATHETERIZATION WITH CORONARY ANGIOGRAM;  Surgeon: Kathleene Hazel, MD;  Location: [facility] CATH LAB;  Service: Cardiovascular;  Laterality: N/A;      Prior to Admission medications   Medication Sig Start Date End Date Taking? Authorizing Provider  ibuprofen (ADVIL,MOTRIN) 200 MG tablet Take 800 mg by mouth every 6 (six) hours as needed (pain).    Yes Historical Provider, MD  cephALEXin (KEFLEX) 500 MG capsule Take 1 capsule (500 mg total) by mouth 4 (four) times daily. 01/24/15   Maris Berger, MD      Allergies: No Known Allergies   Social History:  The patient  reports that he has been smoking Cigarettes.  He has been smoking about 0.25 packs per day. He does not have any smokeless tobacco history on file. He reports that he drinks alcohol. He reports that he does not use illicit drugs.   Family History:  The patient's family history includes Heart disease in his mother.   ROS:  Please see the history of present illness.  All other systems reviewed and negative.   Vital Signs: Blood pressure 122/86, pulse 68, temperature 98 F (36.7 C), temperature source Oral, resp. rate 14, SpO2 99 %.  PHYSICAL EXAM: General:  Thin appearing African-American male  in no distress HEENT: normal Lymph: no adenopathy Neck: no JVD Endocrine:  No thryomegaly Vascular: No carotid bruits; FA pulses 2+ bilaterally without bruits Cardiac:  normal S1, S2; RRR; no murmur Lungs:  clear to auscultation bilaterally, no wheezing, rhonchi or rales Abd: soft, nontender, no hepatomegaly Ext: no edema Musculoskeletal:  No deformities, BUE and BLE strength normal and equal Skin: warm and dry Neuro:  CNs 2-12 intact, no focal abnormalities noted Psych:  Normal affect    EKG:  NSR, HR 63, TWI in 3, aVF  Labs:   Lab Results  Component Value  Date   WBC 10.9* 05/10/2015   HGB 14.6 05/10/2015   HCT 43.4 05/10/2015   MCV 93.5 05/10/2015   PLT 189 05/10/2015      Recent Labs Lab 05/10/15 1050  NA 143  K 3.8  CL 109  CO2 23  BUN 5*  CREATININE 1.13  CALCIUM 8.2*  GLUCOSE 91    No results for input(s): CKTOTAL, CKMB, TROPONINI in the last 72 hours.  No results found for: CHOL, HDL, LDLCALC, TRIG  Lab Results  Component Value Date   DDIMER <0.27 05/10/2015     Radiology/Studies:   Dg Chest 2 View  05/10/2015  CLINICAL DATA:  Chest pain EXAM: CHEST  2 VIEW COMPARISON:  November 30, 2013 FINDINGS: The heart size and mediastinal contours are within normal limits. Both lungs are clear. The visualized skeletal structures are unremarkable. IMPRESSION: No active cardiopulmonary disease. Electronically Signed   By: Dorise Bullion III M.D   On: 05/10/2015 11:53      ASSESSMENT AND PLAN:   1. Chest pain likely secondary to coronary spasm- I suspect Mr. Saiz ongoing chest pain is related to coronary artery spasm. He has had prior MIs although troponin is negative during this event. Possibly because the event was short-lived and resolved after treatment in the ER. We had a discussion about coronary artery spasm and the fact that he is at risk for recurrent MI. Currently he is not on medication for spasm. As he has a low heart rate, beta blockers are contraindicated. I would recommend adding low-dose Norvasc 2.5 mg daily. Given his history of MI he should be on daily aspirin 81 mg. I have advised smoking cessation and we will provide him with 14 mg nicotine patches to assist with this. Given 2 sets of negative coronary markers and resolution of his chest pain, I feel he could be safely discharged and follow-up with [place] in the office this coming week. We have added an ESR to his labs to rule out pericarditis, however he has no positional symptoms that are suggestive of that diagnosis nor any EKG changes to support it.  Thanks for  the consultation.  Pixie Casino, MD, [facility] Attending Cardiologist [facility]   05/10/2015 4:35 PM

## 2015-05-10 NOTE — ED Notes (Signed)
[facility] here for chest pain[facility] Pt chest pain started this am when getting ready for appointment and under some stress. Pt hx of MI

## 2015-05-10 NOTE — ED Notes (Signed)
Pt states he gives plasma 2 x a week, rt ac has multiple stick marks

## 2015-05-20 ENCOUNTER — Ambulatory Visit: Payer: Self-pay | Admitting: Internal Medicine

## 2015-05-22 ENCOUNTER — Encounter: Payer: Self-pay | Admitting: *Deleted

## 2015-06-13 ENCOUNTER — Observation Stay (HOSPITAL_COMMUNITY): Payer: Self-pay | Admitting: Anesthesiology

## 2015-06-13 ENCOUNTER — Emergency Department (HOSPITAL_COMMUNITY): Payer: Self-pay

## 2015-06-13 ENCOUNTER — Encounter (HOSPITAL_COMMUNITY)
Admission: EM | Disposition: A | Discharge status: Home / Self Care | Payer: Self-pay | Source: Home / Self Care | Attending: Emergency Medicine

## 2015-06-13 ENCOUNTER — Encounter (HOSPITAL_COMMUNITY): Payer: Self-pay

## 2015-06-13 ENCOUNTER — Observation Stay (HOSPITAL_COMMUNITY)
Admission: EM | Admit: 2015-06-13 | Discharge: 2015-06-14 | Disposition: A | Discharge status: Home / Self Care | Payer: Self-pay | Attending: Surgery | Admitting: Surgery

## 2015-06-13 DIAGNOSIS — Z7982 Long term (current) use of aspirin: Secondary | ICD-10-CM | POA: Insufficient documentation

## 2015-06-13 DIAGNOSIS — F17210 Nicotine dependence, cigarettes, uncomplicated: Secondary | ICD-10-CM | POA: Insufficient documentation

## 2015-06-13 DIAGNOSIS — Z79899 Other long term (current) drug therapy: Secondary | ICD-10-CM | POA: Insufficient documentation

## 2015-06-13 DIAGNOSIS — E785 Hyperlipidemia, unspecified: Secondary | ICD-10-CM | POA: Insufficient documentation

## 2015-06-13 DIAGNOSIS — I252 Old myocardial infarction: Secondary | ICD-10-CM | POA: Insufficient documentation

## 2015-06-13 DIAGNOSIS — Z113 Encounter for screening for infections with a predominantly sexual mode of transmission: Secondary | ICD-10-CM | POA: Insufficient documentation

## 2015-06-13 DIAGNOSIS — K611 Rectal abscess: Principal | ICD-10-CM | POA: Insufficient documentation

## 2015-06-13 DIAGNOSIS — I2510 Atherosclerotic heart disease of native coronary artery without angina pectoris: Secondary | ICD-10-CM | POA: Insufficient documentation

## 2015-06-13 HISTORY — DX: Anal abscess: K61.0

## 2015-06-13 HISTORY — PX: INCISION AND DRAINAGE PERIRECTAL ABSCESS: SHX1804

## 2015-06-13 LAB — CBC
HCT: 36.9 % — ABNORMAL LOW (ref 39.0–52.0)
Hemoglobin: 12.8 g/dL — ABNORMAL LOW (ref 13.0–17.0)
MCH: 31.3 pg (ref 26.0–34.0)
MCHC: 34.7 g/dL (ref 30.0–36.0)
MCV: 90.2 fL (ref 78.0–100.0)
PLATELETS: 249 10*3/uL (ref 150–400)
RBC: 4.09 MIL/uL — AB (ref 4.22–5.81)
RDW: 13.8 % (ref 11.5–15.5)
WBC: 24.8 10*3/uL — AB (ref 4.0–10.5)

## 2015-06-13 LAB — I-STAT CHEM 8, ED
BUN: 11 mg/dL (ref 6–20)
CHLORIDE: 102 mmol/L (ref 101–111)
CREATININE: 1 mg/dL (ref 0.61–1.24)
Calcium, Ion: 1.1 mmol/L — ABNORMAL LOW (ref 1.12–1.23)
GLUCOSE: 94 mg/dL (ref 65–99)
HCT: 43 % (ref 39.0–52.0)
Hemoglobin: 14.6 g/dL (ref 13.0–17.0)
Potassium: 3.8 mmol/L (ref 3.5–5.1)
Sodium: 138 mmol/L (ref 135–145)
TCO2: 25 mmol/L (ref 0–100)

## 2015-06-13 LAB — HEPATIC FUNCTION PANEL
ALT: 14 U/L — AB (ref 17–63)
AST: 15 U/L (ref 15–41)
Albumin: 3.4 g/dL — ABNORMAL LOW (ref 3.5–5.0)
Alkaline Phosphatase: 83 U/L (ref 38–126)
BILIRUBIN DIRECT: 0.3 mg/dL (ref 0.1–0.5)
BILIRUBIN INDIRECT: 0.8 mg/dL (ref 0.3–0.9)
TOTAL PROTEIN: 6.7 g/dL (ref 6.5–8.1)
Total Bilirubin: 1.1 mg/dL (ref 0.3–1.2)

## 2015-06-13 LAB — RAPID HIV SCREEN (HIV 1/2 AB+AG)
HIV 1/2 ANTIBODIES: NONREACTIVE
HIV-1 P24 Antigen - HIV24: NONREACTIVE

## 2015-06-13 SURGERY — INCISION AND DRAINAGE, ABSCESS, PERIRECTAL
Anesthesia: General

## 2015-06-13 MED ORDER — NICOTINE 14 MG/24HR TD PT24
14.0000 mg | MEDICATED_PATCH | Freq: Every day | TRANSDERMAL | Status: DC
  Administered 2015-06-13 – 2015-06-14 (×2): 14 mg via TRANSDERMAL
  Filled 2015-06-13 (×2): qty 1

## 2015-06-13 MED ORDER — LACTATED RINGERS IV SOLN
INTRAVENOUS | Status: DC | PRN
  Administered 2015-06-13: 19:00:00 via INTRAVENOUS

## 2015-06-13 MED ORDER — PROPOFOL 10 MG/ML IV BOLUS
INTRAVENOUS | Status: DC | PRN
  Administered 2015-06-13: 50 mg via INTRAVENOUS
  Administered 2015-06-13: 150 mg via INTRAVENOUS
  Administered 2015-06-13: 50 mg via INTRAVENOUS

## 2015-06-13 MED ORDER — HYDROMORPHONE HCL 1 MG/ML IJ SOLN
0.5000 mg | INTRAMUSCULAR | Status: DC | PRN
  Administered 2015-06-14 (×2): 1 mg via INTRAVENOUS
  Filled 2015-06-13 (×2): qty 1

## 2015-06-13 MED ORDER — ONDANSETRON HCL 4 MG/2ML IJ SOLN
INTRAMUSCULAR | Status: DC | PRN
  Administered 2015-06-13: 4 mg via INTRAVENOUS

## 2015-06-13 MED ORDER — OXYCODONE-ACETAMINOPHEN 5-325 MG PO TABS
1.0000 | ORAL_TABLET | Freq: Once | ORAL | Status: AC
  Administered 2015-06-13: 1 via ORAL
  Filled 2015-06-13: qty 1

## 2015-06-13 MED ORDER — PROPOFOL 10 MG/ML IV BOLUS
INTRAVENOUS | Status: AC
  Filled 2015-06-13: qty 20

## 2015-06-13 MED ORDER — IOHEXOL 300 MG/ML  SOLN: 100.0000 mL | Freq: Once | INTRAMUSCULAR | Status: DC | PRN

## 2015-06-13 MED ORDER — DIPHENHYDRAMINE HCL 50 MG/ML IJ SOLN: 25.0000 mg | Freq: Four times a day (QID) | INTRAMUSCULAR | Status: DC | PRN

## 2015-06-13 MED ORDER — HYDROMORPHONE HCL 1 MG/ML IJ SOLN
INTRAMUSCULAR | Status: AC
  Filled 2015-06-13: qty 1

## 2015-06-13 MED ORDER — IOPAMIDOL (ISOVUE-300) INJECTION 61%
100.0000 mL | Freq: Once | INTRAVENOUS | Status: AC | PRN
  Administered 2015-06-13: 100 mL via INTRAVENOUS

## 2015-06-13 MED ORDER — KETOROLAC TROMETHAMINE 30 MG/ML IJ SOLN
30.0000 mg | Freq: Four times a day (QID) | INTRAMUSCULAR | Status: DC | PRN
  Administered 2015-06-13: 30 mg via INTRAVENOUS
  Filled 2015-06-13: qty 1

## 2015-06-13 MED ORDER — DIPHENHYDRAMINE HCL 25 MG PO CAPS: 25.0000 mg | ORAL_CAPSULE | Freq: Four times a day (QID) | ORAL | Status: DC | PRN

## 2015-06-13 MED ORDER — ONDANSETRON 4 MG PO TBDP: 4.0000 mg | ORAL_TABLET | Freq: Four times a day (QID) | ORAL | Status: DC | PRN

## 2015-06-13 MED ORDER — POTASSIUM CHLORIDE IN NACL 20-0.9 MEQ/L-% IV SOLN
INTRAVENOUS | Status: DC
  Filled 2015-06-13: qty 1000

## 2015-06-13 MED ORDER — MORPHINE SULFATE (PF) 2 MG/ML IV SOLN: 1.0000 mg | INTRAVENOUS | Status: DC | PRN

## 2015-06-13 MED ORDER — ONDANSETRON HCL 4 MG/2ML IJ SOLN: 4.0000 mg | Freq: Four times a day (QID) | INTRAMUSCULAR | Status: DC | PRN

## 2015-06-13 MED ORDER — KCL IN DEXTROSE-NACL 20-5-0.45 MEQ/L-%-% IV SOLN
INTRAVENOUS | Status: DC
  Administered 2015-06-13 – 2015-06-14 (×3): via INTRAVENOUS
  Filled 2015-06-13 (×3): qty 1000

## 2015-06-13 MED ORDER — ONDANSETRON HCL 4 MG/2ML IJ SOLN
INTRAMUSCULAR | Status: AC
  Filled 2015-06-13: qty 2

## 2015-06-13 MED ORDER — POTASSIUM CHLORIDE 2 MEQ/ML IV SOLN: INTRAVENOUS | Status: DC

## 2015-06-13 MED ORDER — FENTANYL CITRATE (PF) 100 MCG/2ML IJ SOLN
INTRAMUSCULAR | Status: DC | PRN
  Administered 2015-06-13 (×3): 50 ug via INTRAVENOUS

## 2015-06-13 MED ORDER — HYDROMORPHONE HCL 1 MG/ML IJ SOLN
0.2500 mg | INTRAMUSCULAR | Status: DC | PRN
  Administered 2015-06-13 (×2): 0.5 mg via INTRAVENOUS

## 2015-06-13 MED ORDER — DIATRIZOATE MEGLUMINE & SODIUM 66-10 % PO SOLN
30.0000 mL | Freq: Once | ORAL | Status: AC
  Administered 2015-06-13: 30 mL via ORAL

## 2015-06-13 MED ORDER — HYDROMORPHONE HCL 1 MG/ML IJ SOLN
INTRAMUSCULAR | Status: AC
  Filled 2015-06-13: qty 2

## 2015-06-13 MED ORDER — PIPERACILLIN-TAZOBACTAM 3.375 G IVPB
3.3750 g | Freq: Three times a day (TID) | INTRAVENOUS | Status: DC
  Administered 2015-06-13 – 2015-06-14 (×3): 3.375 g via INTRAVENOUS
  Filled 2015-06-13 (×4): qty 50

## 2015-06-13 MED ORDER — LIDOCAINE HCL (CARDIAC) 20 MG/ML IV SOLN
INTRAVENOUS | Status: AC
  Filled 2015-06-13: qty 5

## 2015-06-13 MED ORDER — MIDAZOLAM HCL 5 MG/5ML IJ SOLN
INTRAMUSCULAR | Status: DC | PRN
  Administered 2015-06-13 (×2): 1 mg via INTRAVENOUS

## 2015-06-13 MED ORDER — MIDAZOLAM HCL 2 MG/2ML IJ SOLN
INTRAMUSCULAR | Status: AC
  Filled 2015-06-13: qty 2

## 2015-06-13 MED ORDER — LIDOCAINE HCL (CARDIAC) 20 MG/ML IV SOLN
INTRAVENOUS | Status: DC | PRN
  Administered 2015-06-13: 100 mg via INTRAVENOUS

## 2015-06-13 MED ORDER — FENTANYL CITRATE (PF) 250 MCG/5ML IJ SOLN
INTRAMUSCULAR | Status: AC
  Filled 2015-06-13: qty 5

## 2015-06-13 SURGICAL SUPPLY — 31 items
BENZOIN TINCTURE PRP APPL 2/3 (GAUZE/BANDAGES/DRESSINGS) IMPLANT
BLADE HEX COATED 2.75 (ELECTRODE) IMPLANT
BLADE SURG 15 STRL LF DISP TIS (BLADE) IMPLANT
BLADE SURG 15 STRL SS (BLADE)
BLADE SURG SZ10 CARB STEEL (BLADE) ×6 IMPLANT
CLOSURE WOUND 1/2 X4 (GAUZE/BANDAGES/DRESSINGS)
COVER SURGICAL LIGHT HANDLE (MISCELLANEOUS) IMPLANT
DECANTER SPIKE VIAL GLASS SM (MISCELLANEOUS) IMPLANT
DRAIN PENROSE 18X1/2 LTX STRL (DRAIN) IMPLANT
DRAPE LAPAROTOMY TRNSV 102X78 (DRAPE) IMPLANT
ELECT PENCIL ROCKER SW 15FT (MISCELLANEOUS) IMPLANT
ELECT REM PT RETURN 9FT ADLT (ELECTROSURGICAL) ×3
ELECTRODE REM PT RTRN 9FT ADLT (ELECTROSURGICAL) ×1 IMPLANT
GAUZE SPONGE 4X4 12PLY STRL (GAUZE/BANDAGES/DRESSINGS) ×3 IMPLANT
GLOVE BIOGEL PI IND STRL 7.0 (GLOVE) ×1 IMPLANT
GLOVE BIOGEL PI INDICATOR 7.0 (GLOVE) ×2
GLOVE SURG SIGNA 7.5 PF LTX (GLOVE) ×9 IMPLANT
GOWN SPEC L4 XLG W/TWL (GOWN DISPOSABLE) ×3 IMPLANT
GOWN STRL REUS W/ TWL XL LVL3 (GOWN DISPOSABLE) IMPLANT
GOWN STRL REUS W/TWL LRG LVL3 (GOWN DISPOSABLE) ×3 IMPLANT
GOWN STRL REUS W/TWL XL LVL3 (GOWN DISPOSABLE)
KIT BASIN OR (CUSTOM PROCEDURE TRAY) ×3 IMPLANT
NEEDLE HYPO 25X1 1.5 SAFETY (NEEDLE) IMPLANT
NS IRRIG 1000ML POUR BTL (IV SOLUTION) IMPLANT
PACK BASIC VI WITH GOWN DISP (CUSTOM PROCEDURE TRAY) ×3 IMPLANT
SPONGE LAP 4X18 X RAY DECT (DISPOSABLE) IMPLANT
STRIP CLOSURE SKIN 1/2X4 (GAUZE/BANDAGES/DRESSINGS) IMPLANT
SYR BULB IRRIGATION 50ML (SYRINGE) IMPLANT
SYR CONTROL 10ML LL (SYRINGE) IMPLANT
TOWEL OR 17X26 10 PK STRL BLUE (TOWEL DISPOSABLE) ×3 IMPLANT
YANKAUER SUCT BULB TIP 10FT TU (MISCELLANEOUS) IMPLANT

## 2015-06-13 NOTE — H&P (Signed)
Phillip [name] is an 35 y.o. male.    Chief Complaint: Perirectal abscess HPI: Pt presents to the ED with 2 days of pain.  He was unsure of drainage pain is around his anus.   He has had this problem before.   Work up in the ED shows low grade temp 100.2, VSS.  No CBC, BMP,  CT scan shows:  Complex fluid collection lying posterior to the rectum and extending around the lateral aspect of the rectum bilaterally. This is consistent with the retrorectal/perirectal abscess. This lies approximately 2 cm above the anus.  Past Medical History  Diagnosis Date  . History of acute inferior wall MI     a. LHC 12/08 - oRCA spasm; apical LAD occluded  . Coronary vasospasm (HCC)     a. NSTEMI 10/15 - LHC: no sig CAD;  b. Echo 10/15: EF 50-55%, inf HK  . Hyperlipidemia   . Perianal abscess     Past Surgical History  Procedure Laterality Date  . No previous surgeries    . Left heart catheterization with coronary angiogram N/A 11/30/2013    Procedure: LEFT HEART CATHETERIZATION WITH CORONARY ANGIOGRAM;  Surgeon: Kathleene Hazel, MD;  Location: [facility];  Service: Cardiovascular;  Laterality: N/A;    Family History  Problem Relation Age of Onset  . Heart disease Mother     CHF   Social History:  reports that he has been smoking Cigarettes.  He has been smoking about 0.25 packs per day. He does not have any smokeless tobacco history on file. He reports that he does not drink alcohol or use illicit drugs.  Allergies: No Known Allergies  Prior to Admission medications   Medication Sig Start Date End Date Taking? Authorizing Provider  NIACIN PO Take 1 tablet by mouth 3 (three) times daily.   Yes Historical Provider, MD  amLODipine (NORVASC) 2.5 MG tablet Take 1 tablet (2.5 mg total) by mouth daily. Patient not taking: Reported on 06/13/2015 05/10/15   Raeford Razor, MD  aspirin 81 MG chewable tablet Chew 1 tablet (81 mg total) by mouth daily. 05/11/15   Chrystie Nose, MD  nicotine (NICODERM CQ  - DOSED IN MG/24 HOURS) 14 mg/24hr patch Place 1 patch (14 mg total) onto the skin daily. Wear 14 mg daily for 4-8 weeks, then reduce dose to 7 mg daily. 05/10/15   Beatrice Lecher, PA-C  nicotine (NICODERM CQ) 7 mg/24hr patch Place 1 patch (7 mg total) onto the skin daily. 05/10/15   Chrystie Nose, MD     Results for orders placed or performed during the hospital encounter of 06/13/15 (from the past 48 hour(s))  I-Stat Chem 8, ED     Status: Abnormal   Collection Time: 06/13/15 11:57 AM  Result Value Ref Range   Sodium 138 135 - 145 mmol/L   Potassium 3.8 3.5 - 5.1 mmol/L   Chloride 102 101 - 111 mmol/L   BUN 11 6 - 20 mg/dL   Creatinine, Ser 1.61 0.61 - 1.24 mg/dL   Glucose, Bld 94 65 - 99 mg/dL   Calcium, Ion 0.96 (L) 1.12 - 1.23 mmol/L   TCO2 25 0 - 100 mmol/L   Hemoglobin 14.6 13.0 - 17.0 g/dL   HCT 04.5 40.9 - 81.1 %   Ct Abdomen Pelvis W Contrast  06/13/2015  CLINICAL DATA:  Rectal pain for 2-3 days, history of recent I and D EXAM: CT ABDOMEN AND PELVIS WITH CONTRAST TECHNIQUE: Multidetector CT imaging of  the abdomen and pelvis was performed using the standard protocol following bolus administration of intravenous contrast. CONTRAST:  ISOVUE-300 IOPAMIDOL (ISOVUE-300) INJECTION 61% COMPARISON:  03/02/2011 FINDINGS: Lungs are well aerated bilaterally. No focal infiltrate or sizable effusion is seen. The liver, spleen, gallbladder, adrenal glands and pancreas are within normal limits. The kidneys are well visualized bilaterally. No renal calculi or obstructive changes are noted. The appendix is filled with barium and within normal limits. The bladder is partially distended. No colonic abnormality is seen. Perirectal inflammatory change is identified and posterior to the rectum best seen on image number 73 of series 2, there is a 2.5 x 2.0 by 3.5 cm fluid collection seen. This lies higher along the rectum than the previously seen changes of incision and drainage. Additionally a  collection appears to wrap along the lateral aspect of the rectum bilaterally. No other focal fluid collection is identified. The osseous structures are within normal limits. IMPRESSION: Complex fluid collection lying posterior to the rectum and extending around the lateral aspect of the rectum bilaterally. This is consistent with the retrorectal/perirectal abscess. This lies approximately 2 cm above the anus. Electronically Signed   By: Alcide Clever M.D.   On: 06/13/2015 15:08    Review of Systems  Constitutional: Positive for fever. Negative for chills, weight loss, malaise/fatigue and diaphoresis.  HENT: Negative.   Eyes: Negative.   Respiratory: Negative.   Cardiovascular: Positive for chest pain (no chest pain for a "few weeks.").  Gastrointestinal: Negative.   Genitourinary: Positive for dysuria.       Rectal pain for 2 days, no drainage  Skin: Negative.   Neurological: Negative.  Negative for weakness.  Endo/Heme/Allergies: Negative.   Psychiatric/Behavioral: Negative.     Blood pressure 112/89, pulse 88, temperature 98.8 F (37.1 C), temperature source Oral, resp. rate 15, height  (1.676 m), weight 56.7 kg (125 lb), SpO2 98 %. Physical Exam  Constitutional: He is oriented to person, place, and time.  Thin black male, uncomfortable but in no acute distress  HENT:  Head: Normocephalic and atraumatic.  Nose: Nose normal.  Eyes: Right eye exhibits discharge. Left eye exhibits no discharge. No scleral icterus.  Neck: Normal range of motion. Neck supple. No JVD present. No tracheal deviation present. No thyromegaly present.  Cardiovascular: Normal rate, regular rhythm, normal heart sounds and intact distal pulses.   No murmur heard. Respiratory: Effort normal and breath sounds normal. No respiratory distress. He has no wheezes. He has no rales. He exhibits no tenderness.  GI: Soft. Bowel sounds are normal. He exhibits no distension and no mass. There is no tenderness. There is  no rebound and no guarding.  Genitourinary:  Severe rectal pain he says at the top of his rectum.   Nothing visible on external rectal exam.  He cannot tolerated digital rectal exam.  Musculoskeletal: He exhibits no edema or tenderness.  Lymphadenopathy:    He has no cervical adenopathy.  Neurological: He is alert and oriented to person, place, and time. No cranial nerve deficit.  Skin: Skin is warm and dry. No rash noted. No erythema. No pallor.  Psychiatric: He has a normal mood and affect. His behavior is normal. Judgment and thought content normal.     Assessment/Plan Retrorectal Perirectal abscess CAD with hx of MI/Coronary spasm Tobacco use Possible UTI Screen for STD  Plan:  Admit, start some Zosyn, he is having fever again.  Dr. Carolynne Edouard will discuss with Dr. Ezzard Standing.  NPO for surgery.  He  will need EUA, and I&D of perirectal abscess.  He is also concerned with possible UTI, no screening for STD since last year and he would like to be checked.  I will also do a drug screen.   Myrella Fahs, PA-C 06/13/2015, 4:27 PM

## 2015-06-13 NOTE — Op Note (Signed)
Note made in error.  I don't know how to get rid of it.

## 2015-06-13 NOTE — ED Provider Notes (Signed)
CSN: [id number]     Arrival date & time 06/13/15  1020 History   First MD Initiated Contact with Patient 06/13/15 1054     Chief Complaint  Patient presents with  . Abscess  . Fever   HPI  Mr. Phillip [name] is a 35 year old male with PMHx of coronary vasospasm, MI, HLD and perianal abscess presenting with rectal pain. Onset of pain was 2 days ago. His pain is around the anus. He has not had a bowel movement since the pain started. He reports feeling feverish at home but no documented fevers. Denies dizziness, syncope, abdominal pain, nausea, vomiting, urinary retention, constipation or diarrhea. He has not tried any over-the-counter treatments for his abscess. He reports history of perineal abscess with incision and drainage [facility] department approximately one year ago. No other complaints today.   Past Medical History  Diagnosis Date  . History of acute inferior wall MI     a. LHC 12/08 - oRCA spasm; apical LAD occluded  . Coronary vasospasm (HCC)     a. NSTEMI 10/15 - LHC: no sig CAD;  b. Echo 10/15: EF 50-55%, inf HK  . Hyperlipidemia   . Perianal abscess    Past Surgical History  Procedure Laterality Date  . No previous surgeries    . Left heart catheterization with coronary angiogram N/A 11/30/2013    Procedure: LEFT HEART CATHETERIZATION WITH CORONARY ANGIOGRAM;  Surgeon: Kathleene Hazelhristopher D McAlhany, MD;  [facility];  Service: Cardiovascular;  Laterality: N/A;   Family History  Problem Relation Age of Onset  . Heart disease Mother     CHF   Social History  Substance Use Topics  . Smoking status: Current Every Day Smoker -- 0.25 packs/day    Types: Cigarettes  . Smokeless tobacco: None  . Alcohol Use: No    Review of Systems  All other systems reviewed and are negative.     Allergies  Review of patient's allergies indicates no known allergies.  Home Medications   Prior to Admission medications   Medication Sig Start Date End Date Taking?  Authorizing Provider  NIACIN PO Take 1 tablet by mouth 3 (three) times daily.   Yes Historical Provider, MD  amLODipine (NORVASC) 2.5 MG tablet Take 1 tablet (2.5 mg total) by mouth daily. Patient not taking: Reported on 06/13/2015 05/10/15   Raeford RazorStephen Kohut, MD  aspirin 81 MG chewable tablet Chew 1 tablet (81 mg total) by mouth daily. 05/11/15   Chrystie NoseKenneth C Hilty, MD  nicotine (NICODERM CQ - DOSED IN MG/24 HOURS) 14 mg/24hr patch Place 1 patch (14 mg total) onto the skin daily. Wear 14 mg daily for 4-8 weeks, then reduce dose to 7 mg daily. 05/10/15   Beatrice LecherScott T Weaver, PA-C  nicotine (NICODERM CQ) 7 mg/24hr patch Place 1 patch (7 mg total) onto the skin daily. 05/10/15   Chrystie NoseKenneth C Hilty, MD   BP 111/74 mmHg  Pulse 95  Temp(Src) 100.2 F (37.9 C) (Oral)  Resp 16  Ht 5\' 6"  (1.676 m)  Wt 56.7 kg  BMI 20.19 kg/m2  SpO2 100% Physical Exam  Constitutional: He appears well-developed and well-nourished. He appears distressed.  Appears in pain  HENT:  Head: Normocephalic and atraumatic.  Right Ear: External ear normal.  Left Ear: External ear normal.  Eyes: Conjunctivae are normal. No scleral icterus.  Neck: Normal range of motion.  Cardiovascular: Normal rate.   Pulmonary/Chest: Effort normal.  Abdominal: Soft. He exhibits no distension. There is  no tenderness.  Genitourinary:  No obvious abscess noted in the perineal region. No induration or fluctuance externally. Significant pain on rectal exam. Area of fluctuance on posterior rectal wall.  Musculoskeletal: Normal range of motion.  Moves all extremities spontaneously  Neurological: He is alert. Coordination normal.  Skin: Skin is warm and dry.  Psychiatric: He has a normal mood and affect. His behavior is normal.  Nursing note and vitals reviewed.   ED Course  Procedures (including critical care time) Labs Review Labs Reviewed  I-STAT CHEM 8, ED - Abnormal; Notable for the following:    Calcium, Ion 1.10 (*)    All other components  within normal limits  CBC  URINALYSIS, ROUTINE W REFLEX MICROSCOPIC (NOT AT [facility])  DRUGS OF ABUSE SCREEN W ALC, ROUTINE URINE  BASIC METABOLIC PANEL  CBC  HEPATIC FUNCTION PANEL  RPR  RAPID HIV SCREEN (HIV 1/2 AB+AG)  GC/CHLAMYDIA PROBE AMP () NOT AT [facility]    Imaging Review Ct Abdomen Pelvis W Contrast  06/13/2015  CLINICAL DATA:  Rectal pain for 2-3 days, history of recent I and D EXAM: CT ABDOMEN AND PELVIS WITH CONTRAST TECHNIQUE: Multidetector CT imaging of the abdomen and pelvis was performed using the standard protocol following bolus administration of intravenous contrast. CONTRAST:  ISOVUE-300 IOPAMIDOL (ISOVUE-300) INJECTION 61% COMPARISON:  03/02/2011 FINDINGS: Lungs are well aerated bilaterally. No focal infiltrate or sizable effusion is seen. The liver, spleen, gallbladder, adrenal glands and pancreas are within normal limits. The kidneys are well visualized bilaterally. No renal calculi or obstructive changes are noted. The appendix is filled with barium and within normal limits. The bladder is partially distended. No colonic abnormality is seen. Perirectal inflammatory change is identified and posterior to the rectum best seen on image number 73 of series 2, there is a 2.5 x 2.0 by 3.5 cm fluid collection seen. This lies higher along the rectum than the previously seen changes of incision and drainage. Additionally a collection appears to wrap along the lateral aspect of the rectum bilaterally. No other focal fluid collection is identified. The osseous structures are within normal limits. IMPRESSION: Complex fluid collection lying posterior to the rectum and extending around the lateral aspect of the rectum bilaterally. This is consistent with the retrorectal/perirectal abscess. This lies approximately 2 cm above the anus. Electronically Signed   By: Alcide Clever M.D.   On: 06/13/2015 15:08   I have personally reviewed and evaluated these images and lab results as part  of my medical decision-making.   EKG Interpretation None      MDM   Final diagnoses:  Rectal abscess   35 year old male presenting with rectal pain x 2 days. Mildly febrile to 100.2 degrees. Nontoxic appearing but is visibly in pain. No visible abscess on external exam. Fluctuance palpated on posterior rectal wall. Abdomen pelvis CT shows a retrorectal/perirectal abscess. Consulted surgery who will admit patient from emergency department.    Alveta Heimlich, PA-C 06/13/15 1721  Doug Sou, MD 06/13/15 1731

## 2015-06-13 NOTE — Anesthesia Procedure Notes (Signed)
Procedure Name: LMA Insertion Date/Time: 06/13/2015 7:26 PM Performed by: Orest DikesPETERS, Cherylynn Liszewski J Pre-anesthesia Checklist: Patient identified, Emergency Drugs available, Suction available and Patient being monitored Patient Re-evaluated:Patient Re-evaluated prior to inductionOxygen Delivery Method: Circle system utilized Preoxygenation: Pre-oxygenation with 100% oxygen Intubation Type: IV induction LMA: LMA inserted LMA Size: 4.0 Number of attempts: 1 Placement Confirmation: positive ETCO2 and breath sounds checked- equal and bilateral Tube secured with: Tape Dental Injury: Teeth and Oropharynx as per pre-operative assessment

## 2015-06-13 NOTE — Anesthesia Postprocedure Evaluation (Signed)
Anesthesia Post Note  Patient: Phillip GasterLateef [name]  Procedure(s) Performed: Procedure(s) (LRB): IRRIGATION AND DEBRIDEMENT PERIRECTAL ABSCESS (N/A)  Patient location during evaluation: PACU Anesthesia Type: General Level of consciousness: awake and alert Pain management: pain level controlled Vital Signs Assessment: post-procedure vital signs reviewed and stable Respiratory status: spontaneous breathing, nonlabored ventilation, respiratory function stable and patient connected to nasal cannula oxygen Cardiovascular status: blood pressure returned to baseline and stable Postop Assessment: no signs of nausea or vomiting Anesthetic complications: no    Last Vitals:  Filed Vitals:   06/13/15 2030 06/13/15 2032  BP: 131/85   Pulse: 91 85  Temp:    Resp: 15 19    Last Pain:  Filed Vitals:   06/13/15 2037  PainSc: Asleep                 Sagal Gayton S

## 2015-06-13 NOTE — Transfer of Care (Signed)
Immediate Anesthesia Transfer of Care Note  Patient: Phillip GasterLateef [name]  Procedure(s) Performed: Procedure(s): IRRIGATION AND DEBRIDEMENT PERIRECTAL ABSCESS (N/A)  Patient Location: PACU  Anesthesia Type:General  Level of Consciousness:  sedated, patient cooperative and responds to stimulation  Airway & Oxygen Therapy:Patient Spontanous Breathing and Patient connected to face mask oxgen  Post-op Assessment:  Report given to PACU RN and Post -op Vital signs reviewed and stable  Post vital signs:  Reviewed and stable  Last Vitals:  Filed Vitals:   06/13/15 1749 06/13/15 1805  BP: 121/82 112/67  Pulse: 92 85  Temp: 37.4 C 37.4 C  Resp: 16 16    Complications: No apparent anesthesia complications

## 2015-06-13 NOTE — Anesthesia Preprocedure Evaluation (Signed)
Anesthesia Evaluation  Patient identified by MRN, date of birth, ID band Patient awake    Reviewed: Allergy & Precautions, NPO status , Patient's Chart, lab work & pertinent test results  Airway Mallampati: II  TM Distance: >3 FB Neck ROM: Full    Dental no notable dental hx.    Pulmonary Current Smoker,    Pulmonary exam normal breath sounds clear to auscultation       Cardiovascular + Past MI  Normal cardiovascular exam Rhythm:Regular Rate:Normal     Neuro/Psych negative neurological ROS  negative psych ROS   GI/Hepatic negative GI ROS, Neg liver ROS,   Endo/Other  negative endocrine ROS  Renal/GU negative Renal ROS  negative genitourinary   Musculoskeletal negative musculoskeletal ROS (+)   Abdominal   Peds negative pediatric ROS (+)  Hematology negative hematology ROS (+)   Anesthesia Other Findings   Reproductive/Obstetrics negative OB ROS                             Anesthesia Physical Anesthesia Plan  ASA: III  Anesthesia Plan: General   Post-op Pain Management:    Induction: Intravenous  Airway Management Planned: Oral ETT and LMA  Additional Equipment:   Intra-op Plan:   Post-operative Plan: Extubation in OR  Informed Consent: I have reviewed the patients History and Physical, chart, labs and discussed the procedure including the risks, benefits and alternatives for the proposed anesthesia with the patient or authorized representative who has indicated his/her understanding and acceptance.   Dental advisory given  Plan Discussed with: CRNA and Surgeon  Anesthesia Plan Comments:         Anesthesia Quick Evaluation

## 2015-06-13 NOTE — ED Notes (Addendum)
Barrett, PA-C at bedside.

## 2015-06-13 NOTE — ED Notes (Signed)
Pt complains of pain "from abscess on the top of my butt." Upon assessment no discoloration or abnormality visualized.

## 2015-06-13 NOTE — ED Notes (Signed)
Attempt IV x 2 

## 2015-06-13 NOTE — Progress Notes (Signed)
CM spoke with pt who [place]cp.  CM discussed and provided written information for uninsured accepting pcps, discussed the importance of pcp vs EDP services for f/u care, www.needymeds.org, www.goodrx.com, discounted pharmacies and other [place] resources such as [facility] , [facility], affordable care act, financial assistance, uninsured dental services, [facility] med assist, DSS and  health department  Reviewed resources for [place] uninsured accepting pcps like [facility], family medicine at [address], [facility], [facility], local urgent care centers, [facility], [facility], general medical clinics, [facility] services of the [facility], [facility] urgent care plus others, medication resources, [facility] out patient pharmacies and housing Pt voiced understanding and appreciation of resources provided   Provided [facility] contact information Pt agreed to a referral Cm completed referral Pt to be contact by [facility] clinical liason  Pt noted with 4 ED visits in the last 6 months, recurrent perianal abscess Cm discussed possible reasons for recurrence (infection, poor drainage of previous abscess, GI0  Strongly encouraged pcp for f/u care

## 2015-06-13 NOTE — ED Notes (Signed)
Pt c/o perianal abscess x 2 days.  Pain score 9/10.  Unsure of drainage.  Pt reports same in November 2016.  [facility]

## 2015-06-13 NOTE — Progress Notes (Signed)
Chart reviewed and patient examined.  Has posterior peri-rectal abscess. He had an abscess drained last October in the ER.  He has no underlying chronic infection (such as HIV).  Plan I&D tonight.  He is single. Lives with his sister.  She is not here and he does not need me to call her.  He works for a [employer] agency in [place].  Ovidio Kinavid Jennika Ringgold, MD, [facility] Pager: [phone] Office phone:  [phone]

## 2015-06-14 ENCOUNTER — Encounter: Payer: Self-pay | Admitting: Surgery

## 2015-06-14 LAB — CBC
HEMATOCRIT: 35.4 % — AB (ref 39.0–52.0)
HEMOGLOBIN: 12 g/dL — AB (ref 13.0–17.0)
MCH: 31.4 pg (ref 26.0–34.0)
MCHC: 33.9 g/dL (ref 30.0–36.0)
MCV: 92.7 fL (ref 78.0–100.0)
Platelets: 234 10*3/uL (ref 150–400)
RBC: 3.82 MIL/uL — ABNORMAL LOW (ref 4.22–5.81)
RDW: 14.1 % (ref 11.5–15.5)
WBC: 19.2 10*3/uL — AB (ref 4.0–10.5)

## 2015-06-14 LAB — RPR: RPR: NONREACTIVE

## 2015-06-14 LAB — MRSA PCR SCREENING: MRSA BY PCR: NEGATIVE

## 2015-06-14 MED ORDER — SULFAMETHOXAZOLE-TRIMETHOPRIM 800-160 MG PO TABS: 1.0000 | ORAL_TABLET | Freq: Two times a day (BID) | ORAL | Status: DC

## 2015-06-14 MED ORDER — HYDROCODONE-ACETAMINOPHEN 5-325 MG PO TABS: 1.0000 | ORAL_TABLET | Freq: Four times a day (QID) | ORAL | Status: DC | PRN

## 2015-06-14 NOTE — Discharge Instructions (Signed)
[facility] - DISCHARGE INSTRUCTIONS TO PATIENT  Activity:  Driving - May drive in 2 or 3 days, if doing well.   Lifting - No limit  Wound Care:   Sitz baths 3 times a day for 2 weeks.  Sit in water for 15 minutes each time.  Diet:  As tolerated  Follow up appointment:  Call Dr. Allene PyoNewman's office [facility]) at [phone] for an appointment in 3 weeks.  Medications and dosages:  Resume your home medications.  You have a prescription for:  Vicodin for pain, Septra x 1 week for antibiotics             You can get Colace over the counter for a stool softener  Call Dr. Ezzard StandingNewman or his office  [phone]) if you have:  Temperature greater than 100.4,  Persistent nausea and vomiting,  Severe uncontrolled pain,  Redness, tenderness, or signs of infection (pain, swelling, redness, odor or green/yellow discharge around the site),  Difficulty breathing, headache or visual disturbances,  Any other questions or concerns you may have after discharge.  In an emergency, call 911 or go to an Emergency Department at a nearby hospital.

## 2015-06-14 NOTE — Progress Notes (Signed)
Patient discharged.  Leaving with two prescriptions and a note for work.  Room air.  Denies pain.  Respirations even and unlabored.  A&O x4.  No complaints.

## 2015-06-14 NOTE — Op Note (Signed)
NAMSuzette Battiest:  [name], Phillip                 ACCOUNT NO.:  [id number]  MEDICAL RECORD NO.:  [id number]  LOCATION:  1506                         [facility]  PHYSICIAN:  Sandria Balesavid H. Ezzard StandingNewman, M.D.  DATE OF BIRTH:  1980-09-08  DATE OF PROCEDURE:  06/13/2015                              OPERATIVE REPORT   PREOPERATIVE DIAGNOSIS:  Posterior perirectal abscess.  POSTOPERATIVE DIAGNOSIS:  Posterior perirectal abscess approximately 3 x 6 cm (posterior location, 6 o'clock in lithotomy)  PROCEDURES:  Incision and drainage of posterior perirectal abscess.  SURGEON:  Sandria Balesavid H. Ezzard StandingNewman, M.D.  ANESTHESIA:  General endotracheal supervised by Dr. Aviva SignsGeorge Ross.  ESTIMATED BLOOD LOSS:  Less than 100 mL.  No drains were left.  SPECIMEN:  We did send cultures for aerobic and anaerobic cultures.  INDICATION OF PROCEDURE:  [name], who comes with perirectal pain.  His white blood count is 24,000. On CT scan, he has a posterior perianal abscess.  He had a previous perianal abscess in the fall of 2016, which was treated and drianed in the emergency room.  The indications, potential complications of surgery were explained to the patient.  Potential complications include, but not limited to, bleeding, nerve injury, recurrence of the abscess.  DESCRIPTION OF PROCEDURE:  The patient [facility], underwent a general anesthesia.  He was already on Zosyn as antibiotic.  His buttocks were prepped with Betadine solution.  He was placed in lithotomy position.  A time-out was held and surgical checklist run.  I did an anoscopy on him.  I did a digital exam up until 8 or 9 cm.  In the posterior rectum (in lithotomy), he had a fluctuant area.  I put an 18-gauge needle into an abscess and aspirated about 5 mL of pus and sent this for aerobic and anaerobic cultures.  I then made a linear incision into this abscess and created a hole about 2.5 to 3 cm in size  and got into an abscess approximately 3-4 cm in width and 6 cm in length.  There was no septation.  I irrigated this out with about 400 mL of saline.  There was nothing to pack. I thought I had a well drained good opening, but it was a true perirectal abscess and my incision went into the dentate line up into the rectum.  The patient tolerated the procedure well, and was transferred to recovery room in good condition.  Sponge and needle count were correct at the end of the case.   Sandria Balesavid H. Ezzard StandingNewman, M.D., [facility], scribe for Epic   DHN/MEDQ  D:  06/13/2015  T:  06/14/2015  Job:  [id number]
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
## 2015-06-16 ENCOUNTER — Encounter (HOSPITAL_COMMUNITY): Payer: Self-pay | Admitting: Surgery

## 2015-06-16 LAB — CULTURE, ROUTINE-ABSCESS

## 2016-01-26 ENCOUNTER — Encounter (HOSPITAL_COMMUNITY): Payer: Self-pay | Admitting: Anesthesiology

## 2016-01-26 ENCOUNTER — Encounter (HOSPITAL_COMMUNITY): Admission: EM | Disposition: A | Discharge status: Home / Self Care | Payer: Self-pay | Source: Home / Self Care

## 2016-01-26 ENCOUNTER — Encounter (HOSPITAL_COMMUNITY): Payer: Self-pay

## 2016-01-26 ENCOUNTER — Inpatient Hospital Stay (HOSPITAL_COMMUNITY)
Admission: EM | Admit: 2016-01-26 | Discharge: 2016-01-29 | DRG: 345 | Disposition: A | Discharge status: Home / Self Care | Payer: Self-pay | Attending: Surgery | Admitting: Surgery

## 2016-01-26 ENCOUNTER — Emergency Department (HOSPITAL_COMMUNITY): Payer: Self-pay

## 2016-01-26 DIAGNOSIS — I2510 Atherosclerotic heart disease of native coronary artery without angina pectoris: Secondary | ICD-10-CM | POA: Diagnosis present

## 2016-01-26 DIAGNOSIS — D72829 Elevated white blood cell count, unspecified: Secondary | ICD-10-CM

## 2016-01-26 DIAGNOSIS — K611 Rectal abscess: Principal | ICD-10-CM | POA: Diagnosis present

## 2016-01-26 DIAGNOSIS — F17290 Nicotine dependence, other tobacco product, uncomplicated: Secondary | ICD-10-CM | POA: Diagnosis present

## 2016-01-26 DIAGNOSIS — E785 Hyperlipidemia, unspecified: Secondary | ICD-10-CM | POA: Diagnosis present

## 2016-01-26 DIAGNOSIS — Z8249 Family history of ischemic heart disease and other diseases of the circulatory system: Secondary | ICD-10-CM

## 2016-01-26 DIAGNOSIS — I252 Old myocardial infarction: Secondary | ICD-10-CM

## 2016-01-26 DIAGNOSIS — D62 Acute posthemorrhagic anemia: Secondary | ICD-10-CM | POA: Diagnosis not present

## 2016-01-26 LAB — URINALYSIS, ROUTINE W REFLEX MICROSCOPIC
GLUCOSE, UA: NEGATIVE mg/dL
Hgb urine dipstick: NEGATIVE
KETONES UR: 15 mg/dL — AB
Nitrite: NEGATIVE
PROTEIN: NEGATIVE mg/dL
Specific Gravity, Urine: >1.046 — ABNORMAL HIGH (ref 1.005–1.030)
pH: 6 (ref 5.0–8.0)

## 2016-01-26 LAB — CBC
HCT: 40.8 % (ref 39.0–52.0)
Hemoglobin: 14 g/dL (ref 13.0–17.0)
MCH: 31.8 pg (ref 26.0–34.0)
MCHC: 34.3 g/dL (ref 30.0–36.0)
MCV: 92.7 fL (ref 78.0–100.0)
PLATELETS: 239 10*3/uL (ref 150–400)
RBC: 4.4 MIL/uL (ref 4.22–5.81)
RDW: 13.8 % (ref 11.5–15.5)
WBC: 28.8 10*3/uL — AB (ref 4.0–10.5)

## 2016-01-26 LAB — BASIC METABOLIC PANEL
Anion gap: 11 (ref 5–15)
BUN: 13 mg/dL (ref 6–20)
CHLORIDE: 99 mmol/L — AB (ref 101–111)
CO2: 25 mmol/L (ref 22–32)
Calcium: 8.8 mg/dL — ABNORMAL LOW (ref 8.9–10.3)
Creatinine, Ser: 0.98 mg/dL (ref 0.61–1.24)
GFR calc Af Amer: >60 mL/min (ref >60)
Glucose, Bld: 109 mg/dL — ABNORMAL HIGH (ref 65–99)
Potassium: 3.5 mmol/L (ref 3.5–5.1)
SODIUM: 135 mmol/L (ref 135–145)

## 2016-01-26 LAB — URINE MICROSCOPIC-ADD ON
Bacteria, UA: NONE SEEN
RBC / HPF: NONE SEEN RBC/hpf (ref 0–5)

## 2016-01-26 LAB — RAPID HIV SCREEN (HIV 1/2 AB+AG)
HIV 1/2 ANTIBODIES: NONREACTIVE
HIV-1 P24 ANTIGEN - HIV24: NONREACTIVE

## 2016-01-26 LAB — I-STAT TROPONIN, ED: Troponin i, poc: 0 ng/mL (ref 0.00–0.08)

## 2016-01-26 LAB — I-STAT CG4 LACTIC ACID, ED: LACTIC ACID, VENOUS: 1.1 mmol/L (ref 0.5–1.9)

## 2016-01-26 LAB — MAGNESIUM: MAGNESIUM: 1.8 mg/dL (ref 1.7–2.4)

## 2016-01-26 LAB — CBG MONITORING, ED: GLUCOSE-CAPILLARY: 98 mg/dL (ref 65–99)

## 2016-01-26 SURGERY — INCISION AND DRAINAGE, ABSCESS, PERIRECTAL
Anesthesia: General

## 2016-01-26 MED ORDER — POLYETHYLENE GLYCOL 3350 17 G PO PACK: 17.0000 g | PACK | Freq: Every day | ORAL | Status: DC | PRN

## 2016-01-26 MED ORDER — ACETAMINOPHEN 325 MG PO TABS
650.0000 mg | ORAL_TABLET | Freq: Four times a day (QID) | ORAL | Status: DC | PRN
  Administered 2016-01-26 – 2016-01-27 (×2): 650 mg via ORAL
  Filled 2016-01-26 (×2): qty 2

## 2016-01-26 MED ORDER — PIPERACILLIN-TAZOBACTAM 3.375 G IVPB
3.3750 g | Freq: Three times a day (TID) | INTRAVENOUS | Status: DC
  Administered 2016-01-26 – 2016-01-27 (×3): 3.375 g via INTRAVENOUS
  Filled 2016-01-26 (×5): qty 50

## 2016-01-26 MED ORDER — SODIUM CHLORIDE 0.9 % IJ SOLN
INTRAMUSCULAR | Status: AC
  Filled 2016-01-26: qty 50

## 2016-01-26 MED ORDER — DIPHENHYDRAMINE HCL 50 MG/ML IJ SOLN: 25.0000 mg | Freq: Four times a day (QID) | INTRAMUSCULAR | Status: DC | PRN

## 2016-01-26 MED ORDER — HYDROMORPHONE HCL 1 MG/ML IJ SOLN
1.0000 mg | INTRAMUSCULAR | Status: DC | PRN
  Administered 2016-01-26: 2 mg via INTRAVENOUS
  Filled 2016-01-26: qty 2

## 2016-01-26 MED ORDER — ONDANSETRON 4 MG PO TBDP: 4.0000 mg | ORAL_TABLET | Freq: Four times a day (QID) | ORAL | Status: DC | PRN

## 2016-01-26 MED ORDER — SODIUM CHLORIDE 0.9 % IV BOLUS (SEPSIS)
1000.0000 mL | Freq: Once | INTRAVENOUS | Status: AC
  Administered 2016-01-26: 1000 mL via INTRAVENOUS

## 2016-01-26 MED ORDER — MORPHINE SULFATE (PF) 4 MG/ML IV SOLN
4.0000 mg | Freq: Once | INTRAVENOUS | Status: AC
  Administered 2016-01-26: 4 mg via INTRAVENOUS
  Filled 2016-01-26: qty 1

## 2016-01-26 MED ORDER — HYDROMORPHONE HCL 2 MG/ML IJ SOLN
1.0000 mg | INTRAMUSCULAR | Status: DC | PRN
  Administered 2016-01-26 – 2016-01-27 (×6): 2 mg via INTRAVENOUS
  Filled 2016-01-26 (×6): qty 1

## 2016-01-26 MED ORDER — DIPHENHYDRAMINE HCL 25 MG PO CAPS: 25.0000 mg | ORAL_CAPSULE | Freq: Four times a day (QID) | ORAL | Status: DC | PRN

## 2016-01-26 MED ORDER — ONDANSETRON HCL 4 MG/2ML IJ SOLN
4.0000 mg | Freq: Four times a day (QID) | INTRAMUSCULAR | Status: DC | PRN
  Administered 2016-01-27: 4 mg via INTRAVENOUS

## 2016-01-26 MED ORDER — SODIUM CHLORIDE 0.9 % IV SOLN
Freq: Once | INTRAVENOUS | Status: AC
  Administered 2016-01-26: 14:00:00 via INTRAVENOUS

## 2016-01-26 MED ORDER — IOPAMIDOL (ISOVUE-300) INJECTION 61%
100.0000 mL | Freq: Once | INTRAVENOUS | Status: AC | PRN
  Administered 2016-01-26: 100 mL via INTRAVENOUS

## 2016-01-26 MED ORDER — ONDANSETRON HCL 4 MG/2ML IJ SOLN
4.0000 mg | Freq: Once | INTRAMUSCULAR | Status: AC
  Administered 2016-01-26: 4 mg via INTRAVENOUS
  Filled 2016-01-26: qty 2

## 2016-01-26 MED ORDER — IOPAMIDOL (ISOVUE-300) INJECTION 61%
INTRAVENOUS | Status: AC
  Filled 2016-01-26: qty 100

## 2016-01-26 MED ORDER — HYDRALAZINE HCL 20 MG/ML IJ SOLN: 10.0000 mg | INTRAMUSCULAR | Status: DC | PRN

## 2016-01-26 MED ORDER — KCL IN DEXTROSE-NACL 20-5-0.45 MEQ/L-%-% IV SOLN
INTRAVENOUS | Status: DC
  Administered 2016-01-26: 23:00:00 via INTRAVENOUS
  Filled 2016-01-26 (×3): qty 1000

## 2016-01-26 NOTE — ED Notes (Signed)
PT aware of urine sample, urinal in hand. RN at bedside

## 2016-01-26 NOTE — ED Triage Notes (Addendum)
Per EMS- Patient reports weakness x 2 days and today had increased weakness and dizziness while in the shower. Patient has a history of MI/2008. EKG- NSR with occasional pvc's. Patient also reports that he is suppose to be taking Plavix, but has not taken for 8 years.

## 2016-01-26 NOTE — H&P (Signed)
[facility] Admission Note  Phillip [name] [birth year]  [id number].    Requesting MD: Ellender Hose, MD Chief Complaint/Reason for Consult: recurrent perirectal abscess  HPI:  35 year-old male with a PMH perirectal abscess and coronary vasospasm who presents to [facility] with perirectal pain, generalized weakness, and fatigue. Patient reports similar pain in the past when he had a perirectal I&D in April 2017 by Dr. Lucia Gaskins. Has had intermitted rectal pain in the past that improved with rest however this week the pain did not improve and this morning when he woke up he experienced muscle aches, diaphoresis, intermittent heart palpitations, and weakness/lightheadedness. His pain is described as aching made worse with defecation. Last BM was 3 days ago and was normal. He denies chest pain but does endorse some chest pain on exertion that improves with rest. He denies the use of blood thinning medications - states he took blood thinners for about 2 months after his MI in 2008 however he couldn't afford them so he stopped taking them. He does not see a cardiologist regularly. He smokes 3-4 black and mild daily. 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 Denies PMH diabetes or stroke.  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 Moderate leukocytes in urine - asymptomatic Troponin - 0.0   ROS: Review of Systems  Constitutional: Positive for diaphoresis and malaise/fatigue. Negative for chills, fever and weight loss.  Respiratory: Negative for cough and shortness of breath.   Cardiovascular: Negative for chest pain and palpitations.  Gastrointestinal: Negative for abdominal pain, blood in stool, diarrhea, heartburn, nausea and vomiting.  Genitourinary: Negative for dysuria and urgency.       Rectal pain  Musculoskeletal: Positive for myalgias. Negative for falls and joint  pain.  Neurological: Positive for weakness. Negative for dizziness, loss of consciousness and headaches.  Psychiatric/Behavioral: Negative for substance abuse.    Family History  Problem Relation Age of Onset  . Heart disease Mother     CHF  . Heart failure Mother     Past Medical History:  Diagnosis Date  . Coronary vasospasm ([place])    a. NSTEMI 10/15 - LHC: no sig CAD;  b. Echo 10/15: EF 50-55%, inf HK  . History of acute inferior wall MI    a. LHC 12/08 - oRCA spasm; apical LAD occluded  . Hyperlipidemia   . Perianal abscess     Past Surgical History:  Procedure Laterality Date  . INCISION AND DRAINAGE PERIRECTAL ABSCESS N/A 06/13/2015   Procedure: IRRIGATION AND DEBRIDEMENT PERIRECTAL ABSCESS;  Surgeon: Alphonsa Overall, MD;  Location: [facility] ORS;  Service: General;  Laterality: N/A;  . LEFT HEART CATHETERIZATION WITH CORONARY ANGIOGRAM N/A 11/30/2013   Procedure: LEFT HEART CATHETERIZATION WITH CORONARY ANGIOGRAM;  Surgeon: Burnell Blanks, MD;  Location: [facility] CATH LAB;  Service: Cardiovascular;  Laterality: N/A;  . No previous surgeries      Social History:  reports that he has been smoking Cigars.  He has been smoking about 3.00 packs per day. He has never used smokeless tobacco. He reports that he does not drink alcohol or use drugs.  Allergies: No Known Allergies   (Not in a hospital admission)  Blood pressure 106/82, pulse 112, temperature 98.8 F (37.1 C), temperature source Oral, resp. rate 15, height 5' 6" (1.676 m), weight 130 lb (59 kg), SpO2 100 %. Physical Exam: General: pleasant, thin AA male who is laying in bed  in NAD HEENT: head is normocephalic, atraumatic. Mouth is pink and moist Heart: regular, rate, and rhythm.  No obvious murmurs, gallops, or rubs noted.  Palpable pedal pulses bilaterally Lungs: CTAB, no wheezes, rhonchi, or rales noted.  Respiratory effort nonlabored Abd: soft, NT/ND, +BS GU: significant tenderness on digital rectal exam with  fluctuance appreciated in the posterior rectum, 5-6 o'clock position. Purulence. No significant induration. No visible abnormalities on external exam. MS: all 4 extremities are symmetrical with no cyanosis, clubbing, or edema. Skin: warm and dry Psych: A&Ox3 with an appropriate affect. Neuro: grossly intact, normal speech  Results for orders placed or performed during the hospital encounter of 01/26/16 (from the past 48 hour(s))  CBG monitoring, ED     Status: None   Collection Time: 01/26/16  9:58 AM  Result Value Ref Range   Glucose-Capillary 98 65 - 99 mg/dL  Basic metabolic panel     Status: Abnormal   Collection Time: 01/26/16 10:11 AM  Result Value Ref Range   Sodium 135 135 - 145 mmol/L   Potassium 3.5 3.5 - 5.1 mmol/L   Chloride 99 (L) 101 - 111 mmol/L   CO2 25 22 - 32 mmol/L   Glucose, Bld 109 (H) 65 - 99 mg/dL   BUN 13 6 - 20 mg/dL   Creatinine, Ser 0.98 0.61 - 1.24 mg/dL   Calcium 8.8 (L) 8.9 - 10.3 mg/dL   GFR calc non Af Amer >60 >60 mL/min   GFR calc Af Amer >60 >60 mL/min    Comment: (NOTE) The eGFR has been calculated using the CKD EPI equation. This calculation has not been validated in all clinical situations. eGFR's persistently <60 mL/min signify possible Chronic Kidney Disease.    Anion gap 11 5 - 15  CBC     Status: Abnormal   Collection Time: 01/26/16 10:11 AM  Result Value Ref Range   WBC 28.8 (H) 4.0 - 10.5 K/uL   RBC 4.40 4.22 - 5.81 MIL/uL   Hemoglobin 14.0 13.0 - 17.0 g/dL   HCT 40.8 39.0 - 52.0 %   MCV 92.7 78.0 - 100.0 fL   MCH 31.8 26.0 - 34.0 pg   MCHC 34.3 30.0 - 36.0 g/dL   RDW 13.8 11.5 - 15.5 %   Platelets 239 150 - 400 K/uL  Magnesium     Status: None   Collection Time: 01/26/16 10:11 AM  Result Value Ref Range   Magnesium 1.8 1.7 - 2.4 mg/dL  I-Stat Troponin, ED (not at [facility])     Status: None   Collection Time: 01/26/16 11:15 AM  Result Value Ref Range   Troponin i, poc 0.00 0.00 - 0.08 ng/mL   Comment 3            Comment: Due  to the release kinetics of cTnI, a negative result within the first hours of the onset of symptoms does not rule out myocardial infarction with certainty. If myocardial infarction is still suspected, repeat the test at appropriate intervals.   I-Stat CG4 Lactic Acid, ED     Status: None   Collection Time: 01/26/16 11:16 AM  Result Value Ref Range   Lactic Acid, Venous 1.10 0.5 - 1.9 mmol/L  Rapid HIV screen (HIV 1/2 Ab+Ag)     Status: None   Collection Time: 01/26/16 12:21 PM  Result Value Ref Range   HIV-1 P24 Antigen - HIV24 NON REACTIVE NON REACTIVE   HIV 1/2 Antibodies NON REACTIVE NON REACTIVE   Interpretation (HIV Ag  Ab)      A non reactive test result means that HIV 1 or HIV 2 antibodies and HIV 1 p24 antigen were not detected in the specimen.    Comment: RESULT CALLED TO, READ BACK BY AND VERIFIED WITH: M.TAI RN [phone] A.QUIZON    Ct Abdomen Pelvis W Contrast  Result Date: 01/26/2016 CLINICAL DATA:  Perianal abscess, weakness, dizziness, prior MI EXAM: CT ABDOMEN AND PELVIS WITH CONTRAST TECHNIQUE: Multidetector CT imaging of the abdomen and pelvis was performed using the standard protocol following bolus administration of intravenous contrast. CONTRAST:  1101m ISOVUE-300 IOPAMIDOL (ISOVUE-300) INJECTION 61% COMPARISON:  06/13/2015 FINDINGS: Lower chest: Lung bases are clear. Hepatobiliary: Liver is within normal limits. Gallbladder is unremarkable. No intrahepatic or extrahepatic ductal dilatation. Pancreas: Within normal limits. Spleen: Within normal limits. Adrenals/Urinary Tract: Adrenal glands are within normal limits. Kidneys are within normal limits.  No hydronephrosis. Bladder is within normal limits. Stomach/Bowel: Stomach is within normal limits. No evidence of bowel obstruction. Normal appendix (series 2/image 55). Mild wall thickening along the lower rectum. Suspected fistulous communication with the rectum in the 5 o'clock position (series 2/image 37), just above the  anorectal junction. Associated 2.6 x 4.5 x 4.3 cm perirectal abscess extending from 3:00 to 9:00 (series 2/image 35), previously 2.6 x 3.7 x 3.4 cm. Vascular/Lymphatic: No evidence of abdominal aortic aneurysm. No suspicious abdominopelvic lymphadenopathy. Reproductive: Prostate is unremarkable. Other: No abdominopelvic ascites. Musculoskeletal: Visualized osseous structures are within normal limits. 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 Associated 4.5 cm posterior perirectal abscess, increased. Electronically Signed   By: SJulian HyM.D.   On: 01/26/2016 12:09    Assessment/Plan Perirectal abscess, recurrent Leukocytosis   NPO, IVF, antiemetics, analgesics  IV Zosyn  Admit for incision and drainage in the OR tonight vs tomorrow AM.     CAD with hx of MI/Coronary spasm - troponin negative; EKG for palpitations/weakness pending  Tobacco use  EJill Alexanders [facility] 01/26/2016, 1:34 PM Pager: [phone]: [phone]-Fri 7:00 am-4:30 pm Sat-Sun 7:00 am-11:30 am

## 2016-01-26 NOTE — ED Notes (Signed)
At this time PT still unable to give urine sample 

## 2016-01-26 NOTE — Progress Notes (Signed)
ED Cm noted pt with Cm consult for Medication needs This Cm spoke with and provided resources for pt in April 2017 and encouraged pcp services via [facility] or list of uninsured [place] providers Cm notes in nursing note pt is suppose to take plavix and has not Noted in EDP noted pt scheduled to take norvasc 2/5 and has not  CM unable to find pt listed in PDMI for [facility] use therefore is eligible pending determination of d/c medication list Cm reviewed both meds on goodrx for cost lists to provide to pt

## 2016-01-26 NOTE — Progress Notes (Addendum)
Pt seen by ED CM and he informs Cm "I have not been doing what I should be doing.  A lot has happened. I have moved." Pt verified address in EPIC an # as correct information Pt with HR at 88 with CM at beside CM spoke with [place] who [place]cp.  CM re discussed and provided written information to assist pt with determining choice for uninsured accepting pcps, discussed the importance of pcp vs EDP services for f/u care, www.needymeds.org, www.goodrx.com, discounted pharmacies and other [place] resources such as [facility] , [facility], affordable care act, financial assistance, uninsured dental services, [place] med assist, DSS and  health department  Reviewed resources for [place] uninsured accepting pcps like [facility], [facility] medicine [facility] [address], [facility], [facility], local urgent care centers, [facility], [facility], general medical clinics, [facility] [place], [facility] urgent care plus others, medication resources, [facility] out patient pharmacies and housing Pt voiced understanding and appreciation of resources provided  Pt given cost sheets for norvasc and plavix left in pt belonging bag on bedside chair  Provided [facility] contact information Pt agreed to a referral Cm completed referral Pt to be contact by [facility] clinical liaison

## 2016-01-26 NOTE — ED Provider Notes (Signed)
[facility]-EMERGENCY DEPT Provider Note   CSN: [id number] Arrival date & time: 01/26/16  0944     History   Chief Complaint Chief Complaint  Patient presents with  . Fatigue  . Dizziness  . Chills  . Rectal Pain    HPI Phillip [name] is a 35 y.o. male.  HPI [age] old male with history of coronary vasospasm as well as perianal abscess status post drainage with Dr. Ezzard StandingNewman in April 2017 who presents with generalized fatigue, weakness, and increasing perirectal pain. Patient states that over the last several days, he has noticed progressively worsening pain with bowel movements as well as perianal pain. The pain is an aching, throbbing sensation made worse with bowel movements and sitting. Over the last 24 hours, he has developed generalized fatigue, chills, and palpitations with generalized malaise. Patient has also had mild nausea but no vomiting. His symptoms feel similar to his previous perirectal pain. He has had subjective fevers. Denies any alleviating factors. No other medical complaints. He is not diabetic or immune suppressed.  Past Medical History:  Diagnosis Date  . Coronary vasospasm (HCC)    a. NSTEMI 10/15 - LHC: no sig CAD;  b. Echo 10/15: EF 50-55%, inf HK  . History of acute inferior wall MI    a. LHC 12/08 - oRCA spasm; apical LAD occluded  . Hyperlipidemia   . Perianal abscess     Patient Active Problem List   Diagnosis Date Noted  . Perirectal abscess 06/13/2015  . NSTEMI (non-ST elevated myocardial infarction) (HCC) 11/30/2013  . Elevated troponin     Past Surgical History:  Procedure Laterality Date  . INCISION AND DRAINAGE PERIRECTAL ABSCESS N/A 06/13/2015   Procedure: IRRIGATION AND DEBRIDEMENT PERIRECTAL ABSCESS;  Surgeon: Ovidio Kinavid Newman, MD;  Location: [facility] ORS;  Service: General;  Laterality: N/A;  . LEFT HEART CATHETERIZATION WITH CORONARY ANGIOGRAM N/A 11/30/2013   Procedure: LEFT HEART CATHETERIZATION WITH CORONARY ANGIOGRAM;  Surgeon: Kathleene Hazelhristopher D McAlhany, MD;   Location: [facility] CATH LAB;  Service: Cardiovascular;  Laterality: N/A;  . No previous surgeries         Home Medications    Prior to Admission medications   Medication Sig Start Date End Date Taking? Authorizing Provider  ibuprofen (ADVIL,MOTRIN) 200 MG tablet Take 400 mg by mouth every 6 (six) hours as needed for headache, mild pain or moderate pain.   Yes Historical Provider, MD    Family History Family History  Problem Relation Age of Onset  . Heart disease Mother     CHF  . Heart failure Mother     Social History Social History  Substance Use Topics  . Smoking status: Current Every Day Smoker    Packs/day: 3.00    Types: Cigars  . Smokeless tobacco: Never Used  . Alcohol use No     Allergies   Patient has no known allergies.   Review of Systems Review of Systems  Constitutional: Negative for chills, fatigue and fever.  HENT: Negative for congestion and rhinorrhea.   Eyes: Negative for visual disturbance.  Respiratory: Negative for cough, shortness of breath and wheezing.   Cardiovascular: Negative for chest pain and leg swelling.  Gastrointestinal: Positive for abdominal pain and constipation. Negative for diarrhea, nausea and vomiting.  Genitourinary: Negative for dysuria and flank pain.  Musculoskeletal: Negative for neck pain and neck stiffness.  Skin: Negative for rash and wound.  Allergic/Immunologic: Negative for immunocompromised state.  Neurological: Negative for syncope, weakness and headaches.  All other systems reviewed and  are negative.    Physical Exam Updated Vital Signs BP 111/86   Pulse 87   Temp 98.8 F (37.1 C) (Oral)   Resp 19   Ht 5\' 6"  (1.676 m)   Wt 130 lb (59 kg)   SpO2 98%   BMI 20.98 kg/m   Physical Exam  Constitutional: He is oriented to person, place, and time. He appears well-developed and well-nourished. No distress.  HENT:  Head: Normocephalic and atraumatic.  Eyes: Conjunctivae are normal.  Neck: Neck supple.    Cardiovascular: Normal rate, regular rhythm and normal heart sounds.  Exam reveals no friction rub.   No murmur heard. Pulmonary/Chest: Effort normal and breath sounds normal. No respiratory distress. He has no wheezes. He has no rales.  Abdominal: He exhibits no distension.  Genitourinary:  Genitourinary Comments: Significant tenderness on digital rectal exam with marked tenderness along posterior rectum with palpable fluctuance. No expressible purulence.  Musculoskeletal: He exhibits no edema.  Neurological: He is alert and oriented to person, place, and time. He exhibits normal muscle tone.  Skin: Skin is warm. Capillary refill takes less than 2 seconds.  Psychiatric: He has a normal mood and affect.  Nursing note and vitals reviewed.    ED Treatments / Results  Labs (all labs ordered are listed, but only abnormal results are displayed) Labs Reviewed  BASIC METABOLIC PANEL - Abnormal; Notable for the following:       Result Value   Chloride 99 (*)    Glucose, Bld 109 (*)    Calcium 8.8 (*)    All other components within normal limits  CBC - Abnormal; Notable for the following:    WBC 28.8 (*)    All other components within normal limits  URINALYSIS, ROUTINE W REFLEX MICROSCOPIC (NOT AT [facility]) - Abnormal; Notable for the following:    Color, Urine AMBER (*)    Specific Gravity, Urine >1.046 (*)    Bilirubin Urine SMALL (*)    Ketones, ur 15 (*)    Leukocytes, UA MODERATE (*)    All other components within normal limits  URINE MICROSCOPIC-ADD ON - Abnormal; Notable for the following:    Squamous Epithelial / LPF 0-5 (*)    All other components within normal limits  MAGNESIUM  RAPID HIV SCREEN (HIV 1/2 AB+AG)  BASIC METABOLIC PANEL  CBC  CBG MONITORING, ED  CBG MONITORING, ED  I-STAT CG4 LACTIC ACID, ED  I-STAT TROPOININ, ED  I-STAT CG4 LACTIC ACID, ED    EKG  EKG Interpretation  Date/Time:  Monday January 26 2016 10:02:46 EST Ventricular Rate:  84 PR  Interval:    QRS Duration: 86 QT Interval:  361 QTC Calculation: 427 R Axis:   -81 Text Interpretation:  Sinus rhythm Ventricular bigeminy Biatrial enlargement Left axis deviation Borderline T abnormalities, inferior leads Since last tracing, PVCs more prominent, otherwise no significant change Confirmed by Romney Compean MD, Sheria LangAMERON [phone]([id number]) on 01/26/2016 5:04:35 PM       Radiology Ct Abdomen Pelvis W Contrast  Result Date: 01/26/2016 CLINICAL DATA:  Perianal abscess, weakness, dizziness, prior MI EXAM: CT ABDOMEN AND PELVIS WITH CONTRAST TECHNIQUE: Multidetector CT imaging of the abdomen and pelvis was performed using the standard protocol following bolus administration of intravenous contrast. CONTRAST:  100mL ISOVUE-300 IOPAMIDOL (ISOVUE-300) INJECTION 61% COMPARISON:  06/13/2015 FINDINGS: Lower chest: Lung bases are clear. Hepatobiliary: Liver is within normal limits. Gallbladder is unremarkable. No intrahepatic or extrahepatic ductal dilatation. Pancreas: Within normal limits. Spleen: Within normal limits. Adrenals/Urinary Tract: Adrenal  glands are within normal limits. Kidneys are within normal limits.  No hydronephrosis. Bladder is within normal limits. Stomach/Bowel: Stomach is within normal limits. No evidence of bowel obstruction. Normal appendix (series 2/image 55). Mild wall thickening along the lower rectum. Suspected fistulous communication with the rectum in the 5 o'clock position (series 2/image 37), just above the anorectal junction. Associated 2.6 x 4.5 x 4.3 cm perirectal abscess extending from 3:00 to 9:00 (series 2/image 35), previously 2.6 x 3.7 x 3.4 cm. Vascular/Lymphatic: No evidence of abdominal aortic aneurysm. No suspicious abdominopelvic lymphadenopathy. Reproductive: Prostate is unremarkable. Other: No abdominopelvic ascites. Musculoskeletal: Visualized osseous structures are within normal limits. 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 Associated 4.5 cm  posterior perirectal abscess, increased. Electronically Signed   By: Charline Bills M.D.   On: 01/26/2016 12:09    Procedures Procedures (including critical care time)  Medications Ordered in ED Medications  iopamidol (ISOVUE-300) 61 % injection (not administered)  sodium chloride 0.9 % injection (not administered)  dextrose 5 % and 0.45 % NaCl with KCl 20 mEq/L infusion (not administered)  piperacillin-tazobactam (ZOSYN) IVPB 3.375 g (3.375 g Intravenous New Bag/Given 01/26/16 1447)  HYDROmorphone (DILAUDID) injection 1-2 mg (2 mg Intravenous Given 01/26/16 1447)  polyethylene glycol (MIRALAX / GLYCOLAX) packet 17 g (not administered)  diphenhydrAMINE (BENADRYL) capsule 25 mg (not administered)    Or  diphenhydrAMINE (BENADRYL) injection 25 mg (not administered)  ondansetron (ZOFRAN-ODT) disintegrating tablet 4 mg (not administered)    Or  ondansetron (ZOFRAN) injection 4 mg (not administered)  hydrALAZINE (APRESOLINE) injection 10 mg (not administered)  sodium chloride 0.9 % bolus 1,000 mL (0 mLs Intravenous Stopped 01/26/16 1333)  iopamidol (ISOVUE-300) 61 % injection 100 mL (100 mLs Intravenous Contrast Given 01/26/16 1125)  morphine 4 MG/ML injection 4 mg (4 mg Intravenous Given 01/26/16 1329)  ondansetron (ZOFRAN) injection 4 mg (4 mg Intravenous Given 01/26/16 1329)  0.9 %  sodium chloride infusion ( Intravenous New Bag/Given 01/26/16 1333)     Initial Impression / Assessment and Plan / ED Course  I have reviewed the triage vital signs and the nursing notes.  Pertinent labs & imaging results that were available during my care of the patient were reviewed by me and considered in my medical decision making (see chart for details).  Clinical Course     35 yo M with PMHx as above here with generalized weakness, fatigue, and rectal pain. H/o perirectal abscess. Exam as above. VSS and WNL. Primary suspicion is likely generalized malaise and dehydration 2/2 perirectal abscess, with  subsequent mild symptomatic PVCs. No evidence of STEMI or ischemia on EKG. Labs show mild dehydration and significant leukocytosis, c/w acute infection. CT scan confirms new perirectal abscess. Discussed with [facility], who will admit for operative management. Patient updated and in agreement. VSS.  Final Clinical Impressions(s) / ED Diagnoses   Final diagnoses:  Perirectal abscess  Leukocytosis, unspecified type    New Prescriptions Current Discharge Medication List       Shaune Pollack, MD 01/26/16 1723

## 2016-01-26 NOTE — ED Notes (Signed)
Bed: WA08 Expected date:  Expected time:  Means of arrival:  Comments: EMS-34 y/o weak

## 2016-01-27 ENCOUNTER — Encounter (HOSPITAL_COMMUNITY): Payer: Self-pay

## 2016-01-27 ENCOUNTER — Encounter (HOSPITAL_COMMUNITY): Admission: EM | Disposition: A | Discharge status: Home / Self Care | Payer: Self-pay | Source: Home / Self Care

## 2016-01-27 ENCOUNTER — Observation Stay (HOSPITAL_COMMUNITY): Payer: Self-pay | Admitting: Registered Nurse

## 2016-01-27 HISTORY — PX: INCISION AND DRAINAGE PERIRECTAL ABSCESS: SHX1804

## 2016-01-27 LAB — BASIC METABOLIC PANEL
ANION GAP: 9 (ref 5–15)
BUN: 8 mg/dL (ref 6–20)
CALCIUM: 8.1 mg/dL — AB (ref 8.9–10.3)
CO2: 25 mmol/L (ref 22–32)
Chloride: 102 mmol/L (ref 101–111)
Creatinine, Ser: 0.97 mg/dL (ref 0.61–1.24)
GFR calc Af Amer: >60 mL/min (ref >60)
Glucose, Bld: 104 mg/dL — ABNORMAL HIGH (ref 65–99)
POTASSIUM: 4.1 mmol/L (ref 3.5–5.1)
SODIUM: 136 mmol/L (ref 135–145)

## 2016-01-27 LAB — CBC
HEMATOCRIT: 37.9 % — AB (ref 39.0–52.0)
HEMOGLOBIN: 12.9 g/dL — AB (ref 13.0–17.0)
MCH: 31.2 pg (ref 26.0–34.0)
MCHC: 34 g/dL (ref 30.0–36.0)
MCV: 91.8 fL (ref 78.0–100.0)
Platelets: 213 10*3/uL (ref 150–400)
RBC: 4.13 MIL/uL — ABNORMAL LOW (ref 4.22–5.81)
RDW: 13.8 % (ref 11.5–15.5)
WBC: 24.8 10*3/uL — AB (ref 4.0–10.5)

## 2016-01-27 LAB — LACTIC ACID, PLASMA: LACTIC ACID, VENOUS: 0.7 mmol/L (ref 0.5–1.9)

## 2016-01-27 LAB — SURGICAL PCR SCREEN
MRSA, PCR: NEGATIVE
STAPHYLOCOCCUS AUREUS: NEGATIVE

## 2016-01-27 SURGERY — INCISION AND DRAINAGE, ABSCESS, PERIRECTAL
Anesthesia: General

## 2016-01-27 MED ORDER — PANTOPRAZOLE SODIUM 40 MG IV SOLR
40.0000 mg | Freq: Every day | INTRAVENOUS | Status: DC
  Administered 2016-01-27 – 2016-01-28 (×2): 40 mg via INTRAVENOUS
  Filled 2016-01-27 (×4): qty 40

## 2016-01-27 MED ORDER — HYDROMORPHONE HCL 1 MG/ML IJ SOLN: 0.2500 mg | INTRAMUSCULAR | Status: DC | PRN

## 2016-01-27 MED ORDER — MIDAZOLAM HCL 2 MG/2ML IJ SOLN
INTRAMUSCULAR | Status: AC
  Filled 2016-01-27: qty 2

## 2016-01-27 MED ORDER — FENTANYL CITRATE (PF) 100 MCG/2ML IJ SOLN
INTRAMUSCULAR | Status: AC
  Filled 2016-01-27: qty 2

## 2016-01-27 MED ORDER — LACTATED RINGERS IV SOLN
INTRAVENOUS | Status: DC
  Administered 2016-01-27: 1000 mL via INTRAVENOUS
  Administered 2016-01-27: 11:00:00 via INTRAVENOUS

## 2016-01-27 MED ORDER — ONDANSETRON 4 MG PO TBDP
4.0000 mg | ORAL_TABLET | Freq: Four times a day (QID) | ORAL | Status: DC | PRN
  Filled 2016-01-27: qty 1

## 2016-01-27 MED ORDER — PROPOFOL 10 MG/ML IV BOLUS
INTRAVENOUS | Status: AC
  Filled 2016-01-27: qty 20

## 2016-01-27 MED ORDER — DEXAMETHASONE SODIUM PHOSPHATE 10 MG/ML IJ SOLN
INTRAMUSCULAR | Status: DC | PRN
  Administered 2016-01-27: 10 mg via INTRAVENOUS

## 2016-01-27 MED ORDER — LIDOCAINE 2% (20 MG/ML) 5 ML SYRINGE
INTRAMUSCULAR | Status: DC | PRN
  Administered 2016-01-27: 100 mg via INTRAVENOUS

## 2016-01-27 MED ORDER — MORPHINE SULFATE (PF) 2 MG/ML IV SOLN
1.0000 mg | INTRAVENOUS | Status: DC | PRN
  Administered 2016-01-27 – 2016-01-28 (×2): 1 mg via INTRAVENOUS
  Filled 2016-01-27 (×2): qty 1

## 2016-01-27 MED ORDER — FENTANYL CITRATE (PF) 100 MCG/2ML IJ SOLN
INTRAMUSCULAR | Status: DC | PRN
  Administered 2016-01-27 (×3): 50 ug via INTRAVENOUS

## 2016-01-27 MED ORDER — ONDANSETRON HCL 4 MG/2ML IJ SOLN
INTRAMUSCULAR | Status: AC
  Filled 2016-01-27: qty 2

## 2016-01-27 MED ORDER — MIDAZOLAM HCL 5 MG/5ML IJ SOLN
INTRAMUSCULAR | Status: DC | PRN
  Administered 2016-01-27: 2 mg via INTRAVENOUS

## 2016-01-27 MED ORDER — HEPARIN SODIUM (PORCINE) 5000 UNIT/ML IJ SOLN
5000.0000 [IU] | Freq: Three times a day (TID) | INTRAMUSCULAR | Status: DC
  Administered 2016-01-28 – 2016-01-29 (×5): 5000 [IU] via SUBCUTANEOUS
  Filled 2016-01-27 (×10): qty 1

## 2016-01-27 MED ORDER — DEXAMETHASONE SODIUM PHOSPHATE 10 MG/ML IJ SOLN
INTRAMUSCULAR | Status: AC
  Filled 2016-01-27: qty 1

## 2016-01-27 MED ORDER — LIDOCAINE 2% (20 MG/ML) 5 ML SYRINGE
INTRAMUSCULAR | Status: AC
  Filled 2016-01-27: qty 5

## 2016-01-27 MED ORDER — HYDROCODONE-ACETAMINOPHEN 5-325 MG PO TABS
1.0000 | ORAL_TABLET | ORAL | Status: DC | PRN
  Administered 2016-01-27 – 2016-01-29 (×8): 2 via ORAL
  Filled 2016-01-27 (×8): qty 2

## 2016-01-27 MED ORDER — KCL IN DEXTROSE-NACL 20-5-0.45 MEQ/L-%-% IV SOLN
INTRAVENOUS | Status: DC
  Administered 2016-01-27: 15:00:00 via INTRAVENOUS
  Filled 2016-01-27 (×2): qty 1000

## 2016-01-27 MED ORDER — PROPOFOL 10 MG/ML IV BOLUS
INTRAVENOUS | Status: DC | PRN
  Administered 2016-01-27: 200 mg via INTRAVENOUS

## 2016-01-27 MED ORDER — PIPERACILLIN-TAZOBACTAM 3.375 G IVPB
3.3750 g | Freq: Three times a day (TID) | INTRAVENOUS | Status: DC
  Administered 2016-01-27 – 2016-01-29 (×6): 3.375 g via INTRAVENOUS
  Filled 2016-01-27 (×11): qty 50

## 2016-01-27 MED ORDER — ONDANSETRON HCL 4 MG/2ML IJ SOLN
4.0000 mg | Freq: Four times a day (QID) | INTRAMUSCULAR | Status: DC | PRN
  Administered 2016-01-28: 4 mg via INTRAVENOUS
  Filled 2016-01-27: qty 2

## 2016-01-27 MED ORDER — MORPHINE SULFATE (PF) 10 MG/ML IV SOLN
1.0000 mg | INTRAVENOUS | Status: DC | PRN
  Filled 2016-01-27: qty 1

## 2016-01-27 SURGICAL SUPPLY — 20 items
BLADE SURG SZ20 CARB STEEL (BLADE) ×3 IMPLANT
BNDG GAUZE ELAST 4 BULKY (GAUZE/BANDAGES/DRESSINGS) ×3 IMPLANT
COVER SURGICAL LIGHT HANDLE (MISCELLANEOUS) ×3 IMPLANT
DRAPE SHEET LG 3/4 BI-LAMINATE (DRAPES) ×3 IMPLANT
DRSG PAD ABDOMINAL 8X10 ST (GAUZE/BANDAGES/DRESSINGS) ×3 IMPLANT
ELECT PENCIL ROCKER SW 15FT (MISCELLANEOUS) ×3 IMPLANT
GAUZE IODOFORM PACK 1/2 7832 (GAUZE/BANDAGES/DRESSINGS) ×3 IMPLANT
GAUZE SPONGE 4X4 12PLY STRL (GAUZE/BANDAGES/DRESSINGS) ×3 IMPLANT
GAUZE SPONGE 4X4 16PLY XRAY LF (GAUZE/BANDAGES/DRESSINGS) ×3 IMPLANT
GLOVE BIOGEL M 8.0 STRL (GLOVE) ×3 IMPLANT
GLOVE BIOGEL PI IND STRL 7.0 (GLOVE) ×1 IMPLANT
GLOVE BIOGEL PI INDICATOR 7.0 (GLOVE) ×2
GOWN SPEC L4 XLG W/TWL (GOWN DISPOSABLE) ×3 IMPLANT
GOWN STRL REUS W/TWL LRG LVL3 (GOWN DISPOSABLE) ×3 IMPLANT
GOWN STRL REUS W/TWL XL LVL3 (GOWN DISPOSABLE) ×9 IMPLANT
KIT BASIN OR (CUSTOM PROCEDURE TRAY) ×3 IMPLANT
PACK LITHOTOMY IV (CUSTOM PROCEDURE TRAY) ×3 IMPLANT
SWAB CULTURE ESWAB REG 1ML (MISCELLANEOUS) ×3 IMPLANT
TOWEL OR 17X26 10 PK STRL BLUE (TOWEL DISPOSABLE) ×3 IMPLANT
YANKAUER SUCT BULB TIP 10FT TU (MISCELLANEOUS) ×6 IMPLANT

## 2016-01-27 NOTE — Interval H&P Note (Signed)
History and Physical Interval Note:  01/27/2016 11:06 AM  Phillip [name]  has presented today for surgery, with the diagnosis of perirectal abcess  The various methods of treatment have been discussed with the patient and family. After consideration of risks, benefits and other options for treatment, the patient has consented to  Procedure(s): IRRIGATION AND DEBRIDEMENT PERIRECTAL ABSCESS (N/A) as a surgical intervention .  The patient's history has been reviewed, patient examined, no change in status, stable for surgery.  I have reviewed the patient's chart and labs.  Questions were answered to the patient's satisfaction.     Ruie Sendejo B

## 2016-01-27 NOTE — Transfer of Care (Signed)
Immediate Anesthesia Transfer of Care Note  Patient: Phillip GasterLateef [name]  Procedure(s) Performed: Procedure(s): IRRIGATION AND DEBRIDEMENT PERIRECTAL ABSCESS (N/A)  Patient Location: PACU  Anesthesia Type:General  Level of Consciousness: awake, alert , oriented and patient cooperative  Airway & Oxygen Therapy: Patient Spontanous Breathing and Patient connected to face mask oxygen  Post-op Assessment: Report given to RN, Post -op Vital signs reviewed and stable and Patient moving all extremities  Post vital signs: Reviewed and stable  Last Vitals:  Vitals:   01/27/16 0512 01/27/16 0659  BP: 108/77   Pulse: (!) 111   Resp: (!) 22   Temp: (!) 38.6 C (!) 38.2 C    Last Pain:  Vitals:   01/27/16 1052  TempSrc:   PainSc: 10-Worst pain ever      Patients Stated Pain Goal: 2 (01/27/16 1052)  Complications: No apparent anesthesia complications

## 2016-01-27 NOTE — H&P (View-Only) (Signed)
Patient ID: Phillip [name], male   DOB: December 06, 1980, 35 y.o.   MRN: [id number] [facility] Surgery Progress Note:   * No surgery date entered *  Subjective: Mental status is clear Objective: Vital signs in last 24 hours: Temp:  [98.8 F (37.1 C)-101.5 F (38.6 C)] 100.8 F (38.2 C) (12/05 0659) Pulse Rate:  [87-112] 111 (12/05 0512) Resp:  [15-22] 22 (12/05 0512) BP: (103-222)/(68-86) 108/77 (12/05 0512) SpO2:  [98 %-100 %] 99 % (12/05 0512) Weight:  [59 kg (130 lb)-60 kg (132 lb 4.4 oz)] 60 kg (132 lb 4.4 oz) (12/04 2030)  Intake/Output from previous day: 12/04 0701 - 12/05 0700 In: 550 [P.O.:240; I.V.:260; IV Piggyback:50] Out: 700 [Urine:700] Intake/Output this shift: No intake/output data recorded.  Physical Exam: Work of breathing is not labored;  Lab Results:  Results for orders placed or performed during the hospital encounter of 01/26/16 (from the past 48 hour(s))  CBG monitoring, ED     Status: None   Collection Time: 01/26/16  9:58 AM  Result Value Ref Range   Glucose-Capillary 98 65 - 99 mg/dL  Basic metabolic panel     Status: Abnormal   Collection Time: 01/26/16 10:11 AM  Result Value Ref Range   Sodium 135 135 - 145 mmol/L   Potassium 3.5 3.5 - 5.1 mmol/L   Chloride 99 (L) 101 - 111 mmol/L   CO2 25 22 - 32 mmol/L   Glucose, Bld 109 (H) 65 - 99 mg/dL   BUN 13 6 - 20 mg/dL   Creatinine, Ser 0.98 0.61 - 1.24 mg/dL   Calcium 8.8 (L) 8.9 - 10.3 mg/dL   GFR calc non Af Amer >60 >60 mL/min   GFR calc Af Amer >60 >60 mL/min    Comment: (NOTE) The eGFR has been calculated using the CKD EPI equation. This calculation has not been validated in all clinical situations. eGFR's persistently <60 mL/min signify possible Chronic Kidney Disease.    Anion gap 11 5 - 15  CBC     Status: Abnormal   Collection Time: 01/26/16 10:11 AM  Result Value Ref Range   WBC 28.8 (H) 4.0 - 10.5 K/uL   RBC 4.40 4.22 - 5.81 MIL/uL   Hemoglobin 14.0 13.0 - 17.0 g/dL   HCT 40.8 39.0  - 52.0 %   MCV 92.7 78.0 - 100.0 fL   MCH 31.8 26.0 - 34.0 pg   MCHC 34.3 30.0 - 36.0 g/dL   RDW 13.8 11.5 - 15.5 %   Platelets 239 150 - 400 K/uL  Magnesium     Status: None   Collection Time: 01/26/16 10:11 AM  Result Value Ref Range   Magnesium 1.8 1.7 - 2.4 mg/dL  I-Stat Troponin, ED (not at [facility])     Status: None   Collection Time: 01/26/16 11:15 AM  Result Value Ref Range   Troponin i, poc 0.00 0.00 - 0.08 ng/mL   Comment 3            Comment: Due to the release kinetics of cTnI, a negative result within the first hours of the onset of symptoms does not rule out myocardial infarction with certainty. If myocardial infarction is still suspected, repeat the test at appropriate intervals.   I-Stat CG4 Lactic Acid, ED     Status: None   Collection Time: 01/26/16 11:16 AM  Result Value Ref Range   Lactic Acid, Venous 1.10 0.5 - 1.9 mmol/L  Rapid HIV screen (HIV 1/2 Ab+Ag)  Status: None   Collection Time: 01/26/16 12:21 PM  Result Value Ref Range   HIV-1 P24 Antigen - HIV24 NON REACTIVE NON REACTIVE   HIV 1/2 Antibodies NON REACTIVE NON REACTIVE   Interpretation (HIV Ag Ab)      A non reactive test result means that HIV 1 or HIV 2 antibodies and HIV 1 p24 antigen were not detected in the specimen.    Comment: RESULT CALLED TO, READ BACK BY AND VERIFIED WITH: M.TAI RN [phone] A.QUIZON   Urinalysis, Routine w reflex microscopic     Status: Abnormal   Collection Time: 01/26/16  1:35 PM  Result Value Ref Range   Color, Urine AMBER (A) YELLOW    Comment: BIOCHEMICALS MAY BE AFFECTED BY COLOR   APPearance CLEAR CLEAR   Specific Gravity, Urine >1.046 (H) 1.005 - 1.030   pH 6.0 5.0 - 8.0   Glucose, UA NEGATIVE NEGATIVE mg/dL   Hgb urine dipstick NEGATIVE NEGATIVE   Bilirubin Urine SMALL (A) NEGATIVE   Ketones, ur 15 (A) NEGATIVE mg/dL   Protein, ur NEGATIVE NEGATIVE mg/dL   Nitrite NEGATIVE NEGATIVE   Leukocytes, UA MODERATE (A) NEGATIVE  Urine microscopic-add on      Status: Abnormal   Collection Time: 01/26/16  1:35 PM  Result Value Ref Range   Squamous Epithelial / LPF 0-5 (A) NONE SEEN   WBC, UA 6-30 0 - 5 WBC/hpf   RBC / HPF NONE SEEN 0 - 5 RBC/hpf   Bacteria, UA NONE SEEN NONE SEEN  Basic metabolic panel     Status: Abnormal   Collection Time: 01/27/16  4:25 AM  Result Value Ref Range   Sodium 136 135 - 145 mmol/L   Potassium 4.1 3.5 - 5.1 mmol/L   Chloride 102 101 - 111 mmol/L   CO2 25 22 - 32 mmol/L   Glucose, Bld 104 (H) 65 - 99 mg/dL   BUN 8 6 - 20 mg/dL   Creatinine, Ser 0.97 0.61 - 1.24 mg/dL   Calcium 8.1 (L) 8.9 - 10.3 mg/dL   GFR calc non Af Amer >60 >60 mL/min   GFR calc Af Amer >60 >60 mL/min    Comment: (NOTE) The eGFR has been calculated using the CKD EPI equation. This calculation has not been validated in all clinical situations. eGFR's persistently <60 mL/min signify possible Chronic Kidney Disease.    Anion gap 9 5 - 15  CBC     Status: Abnormal   Collection Time: 01/27/16  4:25 AM  Result Value Ref Range   WBC 24.8 (H) 4.0 - 10.5 K/uL   RBC 4.13 (L) 4.22 - 5.81 MIL/uL   Hemoglobin 12.9 (L) 13.0 - 17.0 g/dL   HCT 37.9 (L) 39.0 - 52.0 %   MCV 91.8 78.0 - 100.0 fL   MCH 31.2 26.0 - 34.0 pg   MCHC 34.0 30.0 - 36.0 g/dL   RDW 13.8 11.5 - 15.5 %   Platelets 213 150 - 400 K/uL  Surgical pcr screen     Status: None   Collection Time: 01/27/16  5:53 AM  Result Value Ref Range   MRSA, PCR NEGATIVE NEGATIVE   Staphylococcus aureus NEGATIVE NEGATIVE    Comment:        The Xpert SA Assay (FDA approved for NASAL specimens in patients over 39 years of age), is one component of a comprehensive surveillance program.  Test performance has been validated by [facility] for patients greater than or equal to 1 year  old. It is not intended to diagnose infection nor to guide or monitor treatment.   Lactic acid, plasma     Status: None   Collection Time: 01/27/16  8:10 AM  Result Value Ref Range   Lactic Acid, Venous  0.7 0.5 - 1.9 mmol/L    Radiology/Results: Ct Abdomen Pelvis W Contrast  Result Date: 01/26/2016 CLINICAL DATA:  Perianal abscess, weakness, dizziness, prior MI EXAM: CT ABDOMEN AND PELVIS WITH CONTRAST TECHNIQUE: Multidetector CT imaging of the abdomen and pelvis was performed using the standard protocol following bolus administration of intravenous contrast. CONTRAST:  179m ISOVUE-300 IOPAMIDOL (ISOVUE-300) INJECTION 61% COMPARISON:  06/13/2015 FINDINGS: Lower chest: Lung bases are clear. Hepatobiliary: Liver is within normal limits. Gallbladder is unremarkable. No intrahepatic or extrahepatic ductal dilatation. Pancreas: Within normal limits. Spleen: Within normal limits. Adrenals/Urinary Tract: Adrenal glands are within normal limits. Kidneys are within normal limits.  No hydronephrosis. Bladder is within normal limits. Stomach/Bowel: Stomach is within normal limits. No evidence of bowel obstruction. Normal appendix (series 2/image 55). Mild wall thickening along the lower rectum. Suspected fistulous communication with the rectum in the 5 o'clock position (series 2/image 37), just above the anorectal junction. Associated 2.6 x 4.5 x 4.3 cm perirectal abscess extending from 3:00 to 9:00 (series 2/image 35), previously 2.6 x 3.7 x 3.4 cm. Vascular/Lymphatic: No evidence of abdominal aortic aneurysm. No suspicious abdominopelvic lymphadenopathy. Reproductive: Prostate is unremarkable. Other: No abdominopelvic ascites. Musculoskeletal: Visualized osseous structures are within normal limits. 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 Associated 4.5 cm posterior perirectal abscess, increased. Electronically Signed   By: SJulian HyM.D.   On: 01/26/2016 12:09    Anti-infectives: Anti-infectives    Start     Dose/Rate Route Frequency Ordered Stop   01/26/16 1430  piperacillin-tazobactam (ZOSYN) IVPB 3.375 g     3.375 g 12.5 mL/hr over 240 Minutes Intravenous Every 8 hours 01/26/16  1415        Assessment/Plan: Problem List: Patient Active Problem List   Diagnosis Date Noted  . Perirectal abscess 06/13/2015  . [place]STEMI (non-ST elevated myocardial infarction) [place] 11/30[place]  . Elevated troponin     I explained exam under anesthesia and drainage of perirectal abscess.  Will proceed to OR this am.  * No surgery date entered *    LOS: 0 days   Matt B. MHassell Done MD, [facility] [phone] [phone] 01/27/2016 9:21 AM.

## 2016-01-27 NOTE — Progress Notes (Signed)
Patient ID: Phillip [name], male   DOB: 10-May-1980, 35 y.o.   MRN: [id number] [facility] Surgery Progress Note:   * No surgery date entered *  Subjective: Mental status is clear Objective: Vital signs in last 24 hours: Temp:  [98.8 F (37.1 C)-101.5 F (38.6 C)] 100.8 F (38.2 C) (12/05 0659) Pulse Rate:  [87-112] 111 (12/05 0512) Resp:  [15-22] 22 (12/05 0512) BP: (103-222)/(68-86) 108/77 (12/05 0512) SpO2:  [98 %-100 %] 99 % (12/05 0512) Weight:  [59 kg (130 lb)-60 kg (132 lb 4.4 oz)] 60 kg (132 lb 4.4 oz) (12/04 2030)  Intake/Output from previous day: 12/04 0701 - 12/05 0700 In: 550 [P.O.:240; I.V.:260; IV Piggyback:50] Out: 700 [Urine:700] Intake/Output this shift: No intake/output data recorded.  Physical Exam: Work of breathing is not labored;  Lab Results:  Results for orders placed or performed during the hospital encounter of 01/26/16 (from the past 48 hour(s))  CBG monitoring, ED     Status: None   Collection Time: 01/26/16  9:58 AM  Result Value Ref Range   Glucose-Capillary 98 65 - 99 mg/dL  Basic metabolic panel     Status: Abnormal   Collection Time: 01/26/16 10:11 AM  Result Value Ref Range   Sodium 135 135 - 145 mmol/L   Potassium 3.5 3.5 - 5.1 mmol/L   Chloride 99 (L) 101 - 111 mmol/L   CO2 25 22 - 32 mmol/L   Glucose, Bld 109 (H) 65 - 99 mg/dL   BUN 13 6 - 20 mg/dL   Creatinine, Ser 0.98 0.61 - 1.24 mg/dL   Calcium 8.8 (L) 8.9 - 10.3 mg/dL   GFR calc non Af Amer >60 >60 mL/min   GFR calc Af Amer >60 >60 mL/min    Comment: (NOTE) The eGFR has been calculated using the CKD EPI equation. This calculation has not been validated in all clinical situations. eGFR's persistently <60 mL/min signify possible Chronic Kidney Disease.    Anion gap 11 5 - 15  CBC     Status: Abnormal   Collection Time: 01/26/16 10:11 AM  Result Value Ref Range   WBC 28.8 (H) 4.0 - 10.5 K/uL   RBC 4.40 4.22 - 5.81 MIL/uL   Hemoglobin 14.0 13.0 - 17.0 g/dL   HCT 40.8 39.0  - 52.0 %   MCV 92.7 78.0 - 100.0 fL   MCH 31.8 26.0 - 34.0 pg   MCHC 34.3 30.0 - 36.0 g/dL   RDW 13.8 11.5 - 15.5 %   Platelets 239 150 - 400 K/uL  Magnesium     Status: None   Collection Time: 01/26/16 10:11 AM  Result Value Ref Range   Magnesium 1.8 1.7 - 2.4 mg/dL  I-Stat Troponin, ED (not at [facility])     Status: None   Collection Time: 01/26/16 11:15 AM  Result Value Ref Range   Troponin i, poc 0.00 0.00 - 0.08 ng/mL   Comment 3            Comment: Due to the release kinetics of cTnI, a negative result within the first hours of the onset of symptoms does not rule out myocardial infarction with certainty. If myocardial infarction is still suspected, repeat the test at appropriate intervals.   I-Stat CG4 Lactic Acid, ED     Status: None   Collection Time: 01/26/16 11:16 AM  Result Value Ref Range   Lactic Acid, Venous 1.10 0.5 - 1.9 mmol/L  Rapid HIV screen (HIV 1/2 Ab+Ag)  Status: None   Collection Time: 01/26/16 12:21 PM  Result Value Ref Range   HIV-1 P24 Antigen - HIV24 NON REACTIVE NON REACTIVE   HIV 1/2 Antibodies NON REACTIVE NON REACTIVE   Interpretation (HIV Ag Ab)      A non reactive test result means that HIV 1 or HIV 2 antibodies and HIV 1 p24 antigen were not detected in the specimen.    Comment: RESULT CALLED TO, READ BACK BY AND VERIFIED WITH: M.TAI RN [phone] A.QUIZON   Urinalysis, Routine w reflex microscopic     Status: Abnormal   Collection Time: 01/26/16  1:35 PM  Result Value Ref Range   Color, Urine AMBER (A) YELLOW    Comment: BIOCHEMICALS MAY BE AFFECTED BY COLOR   APPearance CLEAR CLEAR   Specific Gravity, Urine >1.046 (H) 1.005 - 1.030   pH 6.0 5.0 - 8.0   Glucose, UA NEGATIVE NEGATIVE mg/dL   Hgb urine dipstick NEGATIVE NEGATIVE   Bilirubin Urine SMALL (A) NEGATIVE   Ketones, ur 15 (A) NEGATIVE mg/dL   Protein, ur NEGATIVE NEGATIVE mg/dL   Nitrite NEGATIVE NEGATIVE   Leukocytes, UA MODERATE (A) NEGATIVE  Urine microscopic-add on      Status: Abnormal   Collection Time: 01/26/16  1:35 PM  Result Value Ref Range   Squamous Epithelial / LPF 0-5 (A) NONE SEEN   WBC, UA 6-30 0 - 5 WBC/hpf   RBC / HPF NONE SEEN 0 - 5 RBC/hpf   Bacteria, UA NONE SEEN NONE SEEN  Basic metabolic panel     Status: Abnormal   Collection Time: 01/27/16  4:25 AM  Result Value Ref Range   Sodium 136 135 - 145 mmol/L   Potassium 4.1 3.5 - 5.1 mmol/L   Chloride 102 101 - 111 mmol/L   CO2 25 22 - 32 mmol/L   Glucose, Bld 104 (H) 65 - 99 mg/dL   BUN 8 6 - 20 mg/dL   Creatinine, Ser 0.97 0.61 - 1.24 mg/dL   Calcium 8.1 (L) 8.9 - 10.3 mg/dL   GFR calc non Af Amer >60 >60 mL/min   GFR calc Af Amer >60 >60 mL/min    Comment: (NOTE) The eGFR has been calculated using the CKD EPI equation. This calculation has not been validated in all clinical situations. eGFR's persistently <60 mL/min signify possible Chronic Kidney Disease.    Anion gap 9 5 - 15  CBC     Status: Abnormal   Collection Time: 01/27/16  4:25 AM  Result Value Ref Range   WBC 24.8 (H) 4.0 - 10.5 K/uL   RBC 4.13 (L) 4.22 - 5.81 MIL/uL   Hemoglobin 12.9 (L) 13.0 - 17.0 g/dL   HCT 37.9 (L) 39.0 - 52.0 %   MCV 91.8 78.0 - 100.0 fL   MCH 31.2 26.0 - 34.0 pg   MCHC 34.0 30.0 - 36.0 g/dL   RDW 13.8 11.5 - 15.5 %   Platelets 213 150 - 400 K/uL  Surgical pcr screen     Status: None   Collection Time: 01/27/16  5:53 AM  Result Value Ref Range   MRSA, PCR NEGATIVE NEGATIVE   Staphylococcus aureus NEGATIVE NEGATIVE    Comment:        The Xpert SA Assay (FDA approved for NASAL specimens in patients over 45 years of age), is one component of a comprehensive surveillance program.  Test performance has been validated by [facility] for patients greater than or equal to 1 year  old. It is not intended to diagnose infection nor to guide or monitor treatment.   Lactic acid, plasma     Status: None   Collection Time: 01/27/16  8:10 AM  Result Value Ref Range   Lactic Acid, Venous  0.7 0.5 - 1.9 mmol/L    Radiology/Results: Ct Abdomen Pelvis W Contrast  Result Date: 01/26/2016 CLINICAL DATA:  Perianal abscess, weakness, dizziness, prior MI EXAM: CT ABDOMEN AND PELVIS WITH CONTRAST TECHNIQUE: Multidetector CT imaging of the abdomen and pelvis was performed using the standard protocol following bolus administration of intravenous contrast. CONTRAST:  118m ISOVUE-300 IOPAMIDOL (ISOVUE-300) INJECTION 61% COMPARISON:  06/13/2015 FINDINGS: Lower chest: Lung bases are clear. Hepatobiliary: Liver is within normal limits. Gallbladder is unremarkable. No intrahepatic or extrahepatic ductal dilatation. Pancreas: Within normal limits. Spleen: Within normal limits. Adrenals/Urinary Tract: Adrenal glands are within normal limits. Kidneys are within normal limits.  No hydronephrosis. Bladder is within normal limits. Stomach/Bowel: Stomach is within normal limits. No evidence of bowel obstruction. Normal appendix (series 2/image 55). Mild wall thickening along the lower rectum. Suspected fistulous communication with the rectum in the 5 o'clock position (series 2/image 37), just above the anorectal junction. Associated 2.6 x 4.5 x 4.3 cm perirectal abscess extending from 3:00 to 9:00 (series 2/image 35), previously 2.6 x 3.7 x 3.4 cm. Vascular/Lymphatic: No evidence of abdominal aortic aneurysm. No suspicious abdominopelvic lymphadenopathy. Reproductive: Prostate is unremarkable. Other: No abdominopelvic ascites. Musculoskeletal: Visualized osseous structures are within normal limits. 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 Associated 4.5 cm posterior perirectal abscess, increased. Electronically Signed   By: SJulian HyM.D.   On: 01/26/2016 12:09    Anti-infectives: Anti-infectives    Start     Dose/Rate Route Frequency Ordered Stop   01/26/16 1430  piperacillin-tazobactam (ZOSYN) IVPB 3.375 g     3.375 g 12.5 mL/hr over 240 Minutes Intravenous Every 8 hours 01/26/16  1415        Assessment/Plan: Problem List: Patient Active Problem List   Diagnosis Date Noted  . Perirectal abscess 06/13/2015  . [place]STEMI (non[place]ST [place] 11[place]  . Elevated troponin     I explained exam under anesthesia and drainage of perirectal abscess.  Will proceed to OR this am.  * No surgery date entered *    LOS: 0 days   Matt B. MHassell Done MD, FSutter [facility] CenterSur[facility], P.[facility] [phone] [phone] 01/27/2016 9:21 AM.

## 2016-01-27 NOTE — Op Note (Signed)
Surgeon: Wenda LowMatt Keymani Glynn, MD, FACS  Asst:  none  Anes:  general  Procedure: Incision and drainage of perirectal abscess  Diagnosis: Recurrent x 3 perirectal abscess  Complications: none  EBL:   5 cc  Drains: none  Description of Procedure:  [facility]  After anesthesia was administered and the patient was prepped a timeout was performed.  Visual and manual examination of the perianal region was unremarkable for a fluctuant area on the outside. The obturator bullet was inserted into the anus inspected. No obvious fistula was noted. Of induration all on posteriorly. I went over on the patient's left side inserted a needle and got, white creamy pus and sent out for culture. I then made a radial incision in that region and inserted my Kelly clamp and opened but I didn't required splashes pus that I hadn't expected. Somewhere 1 over on the right side and made a similar radial incision after inserting the p.m. needle getting pus and an open that area. I irrigated both areas and then packed them. The midline and obtained more pus so I elected to leave a needle and placement cut down on the nail I cut into a pocket that was more the central horseshoe abscess in that it was on both sides. I inserted the suction catheter on both sides and irrigated with saline before packing with some iodoform gauze. An ABD pad and panties were placed to hold the dressing in place. Patient be continued on Zosyn.  The patient tolerated the procedure well and was taken to the PACU in stable condition.     Matt B. Daphine DeutscherMartin, MD, [facility], [place] [phone]

## 2016-01-27 NOTE — Anesthesia Postprocedure Evaluation (Signed)
Anesthesia Post Note  Patient: Phillip [name]  Procedure(s) Performed: Procedure(s) (LRB): IRRIGATION AND DEBRIDEMENT PERIRECTAL ABSCESS (N/A)  Patient location during evaluation: PACU Anesthesia Type: General Level of consciousness: awake Pain management: pain level controlled Vital Signs Assessment: post-procedure vital signs reviewed and stable Respiratory status: spontaneous breathing Cardiovascular status: stable Anesthetic complications: no    Last Vitals:  Vitals:   01/27/16 0659 01/27/16 1238  BP:  121/90  Pulse:  92  Resp:  15  Temp: (!) 38.2 C 36.6 C    Last Pain:  Vitals:   01/27/16 1052  TempSrc:   PainSc: 10-Worst pain ever                 EDWARDS,Rajeev Escue

## 2016-01-27 NOTE — Anesthesia Preprocedure Evaluation (Signed)
Anesthesia Evaluation  Patient identified by MRN, date of birth, ID band Patient awake    Reviewed: Allergy & Precautions, NPO status , Patient's Chart, lab work & pertinent test results  Airway Mallampati: II  TM Distance: >3 FB     Dental   Pulmonary Current Smoker,    breath sounds clear to auscultation       Cardiovascular + CAD and + Past MI   Rhythm:Regular Rate:Normal     Neuro/Psych  Neuromuscular disease    GI/Hepatic negative GI ROS, Neg liver ROS,   Endo/Other  negative endocrine ROS  Renal/GU negative Renal ROS     Musculoskeletal   Abdominal   Peds  Hematology negative hematology ROS (+)   Anesthesia Other Findings   Reproductive/Obstetrics                             Anesthesia Physical Anesthesia Plan  ASA: III  Anesthesia Plan: General   Post-op Pain Management:    Induction: Intravenous  Airway Management Planned: Oral ETT  Additional Equipment:   Intra-op Plan:   Post-operative Plan: Extubation in OR  Informed Consent: I have reviewed the patients History and Physical, chart, labs and discussed the procedure including the risks, benefits and alternatives for the proposed anesthesia with the patient or authorized representative who has indicated his/her understanding and acceptance.   Dental advisory given  Plan Discussed with: CRNA and Anesthesiologist  Anesthesia Plan Comments:         Anesthesia Quick Evaluation

## 2016-01-27 NOTE — Anesthesia Procedure Notes (Signed)
Procedure Name: LMA Insertion Date/Time: 01/27/2016 11:36 AM Performed by: Jhonnie GarnerMARSHALL, Saliah Crisp M Pre-anesthesia Checklist: Patient identified, Emergency Drugs available, Suction available and Patient being monitored Patient Re-evaluated:Patient Re-evaluated prior to inductionOxygen Delivery Method: Circle system utilized Preoxygenation: Pre-oxygenation with 100% oxygen Intubation Type: IV induction Ventilation: Mask ventilation without difficulty LMA: LMA inserted LMA Size: 4.0 Number of attempts: 1 Placement Confirmation: positive ETCO2 and breath sounds checked- equal and bilateral Tube secured with: Tape Dental Injury: Teeth and Oropharynx as per pre-operative assessment

## 2016-01-28 LAB — CBC
HEMATOCRIT: 35.2 % — AB (ref 39.0–52.0)
HEMOGLOBIN: 11.7 g/dL — AB (ref 13.0–17.0)
MCH: 30.7 pg (ref 26.0–34.0)
MCHC: 33.2 g/dL (ref 30.0–36.0)
MCV: 92.4 fL (ref 78.0–100.0)
Platelets: 256 10*3/uL (ref 150–400)
RBC: 3.81 MIL/uL — ABNORMAL LOW (ref 4.22–5.81)
RDW: 13.8 % (ref 11.5–15.5)
WBC: 31.8 10*3/uL — ABNORMAL HIGH (ref 4.0–10.5)

## 2016-01-28 LAB — BASIC METABOLIC PANEL
Anion gap: 7 (ref 5–15)
BUN: 7 mg/dL (ref 6–20)
CHLORIDE: 103 mmol/L (ref 101–111)
CO2: 29 mmol/L (ref 22–32)
CREATININE: 0.92 mg/dL (ref 0.61–1.24)
Calcium: 9.1 mg/dL (ref 8.9–10.3)
GFR calc Af Amer: >60 mL/min (ref >60)
GFR calc non Af Amer: >60 mL/min (ref >60)
Glucose, Bld: 141 mg/dL — ABNORMAL HIGH (ref 65–99)
Potassium: 4.1 mmol/L (ref 3.5–5.1)
Sodium: 139 mmol/L (ref 135–145)

## 2016-01-28 NOTE — Progress Notes (Signed)
Patient ID: Phillip [name], male   DOB: 26-Jun-1980, 35 y.o.   MRN: [id number] [facility] Surgery Progress Note:   1 Day Post-Op  Subjective: Mental status is clear.  Feeling much better Objective: Vital signs in last 24 hours: Temp:  [97.8 F (36.6 C)-99 F (37.2 C)] 98.5 F (36.9 C) (12/06 0602) Pulse Rate:  [75-100] 75 (12/06 0602) Resp:  [15-18] 18 (12/06 0602) BP: (109-121)/(73-90) 109/73 (12/06 0602) SpO2:  [100 %] 100 % (12/06 0602)  Intake/Output from previous day: 12/05 0701 - 12/06 0700 In: 1830 [P.O.:480; I.V.:1200; IV Piggyback:150] Out: 1950 [EZMOQ:9476; Blood:25] Intake/Output this shift: No intake/output data recorded.  Physical Exam: Work of breathing is not labored.  Pain is markedly better.  Three drain sites have wicks in them for now.    Lab Results:  Results for orders placed or performed during the hospital encounter of 01/26/16 (from the past 48 hour(s))  Basic metabolic panel     Status: Abnormal   Collection Time: 01/26/16 10:11 AM  Result Value Ref Range   Sodium 135 135 - 145 mmol/L   Potassium 3.5 3.5 - 5.1 mmol/L   Chloride 99 (L) 101 - 111 mmol/L   CO2 25 22 - 32 mmol/L   Glucose, Bld 109 (H) 65 - 99 mg/dL   BUN 13 6 - 20 mg/dL   Creatinine, Ser 0.98 0.61 - 1.24 mg/dL   Calcium 8.8 (L) 8.9 - 10.3 mg/dL   GFR calc non Af Amer >60 >60 mL/min   GFR calc Af Amer >60 >60 mL/min    Comment: (NOTE) The eGFR has been calculated using the CKD EPI equation. This calculation has not been validated in all clinical situations. eGFR's persistently <60 mL/min signify possible Chronic Kidney Disease.    Anion gap 11 5 - 15  CBC     Status: Abnormal   Collection Time: 01/26/16 10:11 AM  Result Value Ref Range   WBC 28.8 (H) 4.0 - 10.5 K/uL   RBC 4.40 4.22 - 5.81 MIL/uL   Hemoglobin 14.0 13.0 - 17.0 g/dL   HCT 40.8 39.0 - 52.0 %   MCV 92.7 78.0 - 100.0 fL   MCH 31.8 26.0 - 34.0 pg   MCHC 34.3 30.0 - 36.0 g/dL   RDW 13.8 11.5 - 15.5 %   Platelets  239 150 - 400 K/uL  Magnesium     Status: None   Collection Time: 01/26/16 10:11 AM  Result Value Ref Range   Magnesium 1.8 1.7 - 2.4 mg/dL  I-Stat Troponin, ED (not at [facility])     Status: None   Collection Time: 01/26/16 11:15 AM  Result Value Ref Range   Troponin i, poc 0.00 0.00 - 0.08 ng/mL   Comment 3            Comment: Due to the release kinetics of cTnI, a negative result within the first hours of the onset of symptoms does not rule out myocardial infarction with certainty. If myocardial infarction is still suspected, repeat the test at appropriate intervals.   I-Stat CG4 Lactic Acid, ED     Status: None   Collection Time: 01/26/16 11:16 AM  Result Value Ref Range   Lactic Acid, Venous 1.10 0.5 - 1.9 mmol/L  Rapid HIV screen (HIV 1/2 Ab+Ag)     Status: None   Collection Time: 01/26/16 12:21 PM  Result Value Ref Range   HIV-1 P24 Antigen - HIV24 NON REACTIVE NON REACTIVE   HIV 1/2 Antibodies NON REACTIVE  NON REACTIVE   Interpretation (HIV Ag Ab)      A non reactive test result means that HIV 1 or HIV 2 antibodies and HIV 1 p24 antigen were not detected in the specimen.    Comment: RESULT CALLED TO, READ BACK BY AND VERIFIED WITH: M.TAI RN [phone] A.QUIZON   Urinalysis, Routine w reflex microscopic     Status: Abnormal   Collection Time: 01/26/16  1:35 PM  Result Value Ref Range   Color, Urine AMBER (A) YELLOW    Comment: BIOCHEMICALS MAY BE AFFECTED BY COLOR   APPearance CLEAR CLEAR   Specific Gravity, Urine >1.046 (H) 1.005 - 1.030   pH 6.0 5.0 - 8.0   Glucose, UA NEGATIVE NEGATIVE mg/dL   Hgb urine dipstick NEGATIVE NEGATIVE   Bilirubin Urine SMALL (A) NEGATIVE   Ketones, ur 15 (A) NEGATIVE mg/dL   Protein, ur NEGATIVE NEGATIVE mg/dL   Nitrite NEGATIVE NEGATIVE   Leukocytes, UA MODERATE (A) NEGATIVE  Urine microscopic-add on     Status: Abnormal   Collection Time: 01/26/16  1:35 PM  Result Value Ref Range   Squamous Epithelial / LPF 0-5 (A) NONE SEEN   WBC,  UA 6-30 0 - 5 WBC/hpf   RBC / HPF NONE SEEN 0 - 5 RBC/hpf   Bacteria, UA NONE SEEN NONE SEEN  Basic metabolic panel     Status: Abnormal   Collection Time: 01/27/16  4:25 AM  Result Value Ref Range   Sodium 136 135 - 145 mmol/L   Potassium 4.1 3.5 - 5.1 mmol/L   Chloride 102 101 - 111 mmol/L   CO2 25 22 - 32 mmol/L   Glucose, Bld 104 (H) 65 - 99 mg/dL   BUN 8 6 - 20 mg/dL   Creatinine, Ser 0.97 0.61 - 1.24 mg/dL   Calcium 8.1 (L) 8.9 - 10.3 mg/dL   GFR calc non Af Amer >60 >60 mL/min   GFR calc Af Amer >60 >60 mL/min    Comment: (NOTE) The eGFR has been calculated using the CKD EPI equation. This calculation has not been validated in all clinical situations. eGFR's persistently <60 mL/min signify possible Chronic Kidney Disease.    Anion gap 9 5 - 15  CBC     Status: Abnormal   Collection Time: 01/27/16  4:25 AM  Result Value Ref Range   WBC 24.8 (H) 4.0 - 10.5 K/uL   RBC 4.13 (L) 4.22 - 5.81 MIL/uL   Hemoglobin 12.9 (L) 13.0 - 17.0 g/dL   HCT 37.9 (L) 39.0 - 52.0 %   MCV 91.8 78.0 - 100.0 fL   MCH 31.2 26.0 - 34.0 pg   MCHC 34.0 30.0 - 36.0 g/dL   RDW 13.8 11.5 - 15.5 %   Platelets 213 150 - 400 K/uL  Surgical pcr screen     Status: None   Collection Time: 01/27/16  5:53 AM  Result Value Ref Range   MRSA, PCR NEGATIVE NEGATIVE   Staphylococcus aureus NEGATIVE NEGATIVE    Comment:        The Xpert SA Assay (FDA approved for NASAL specimens in patients over 31 years of age), is one component of a comprehensive surveillance program.  Test performance has been validated by [facility] for patients greater than or equal to 91 year old. It is not intended to diagnose infection nor to guide or monitor treatment.   Lactic acid, plasma     Status: None   Collection Time: 01/27/16  8:10  AM  Result Value Ref Range   Lactic Acid, Venous 0.7 0.5 - 1.9 mmol/L  Aerobic/Anaerobic Culture (surgical/deep wound)     Status: None (Preliminary result)   Collection Time: 01/27/16  12:10 PM  Result Value Ref Range   Specimen Description ABSCESS PERIRECTAL    Special Requests NONE    Gram Stain      RARE WBC PRESENT,BOTH PMN AND MONONUCLEAR FEW GRAM POSITIVE COCCI RARE GRAM NEGATIVE RODS Performed at [facility]    Culture PENDING    Report Status PENDING   CBC     Status: Abnormal   Collection Time: 01/28/16  3:47 AM  Result Value Ref Range   WBC 31.8 (H) 4.0 - 10.5 K/uL   RBC 3.81 (L) 4.22 - 5.81 MIL/uL   Hemoglobin 11.7 (L) 13.0 - 17.0 g/dL   HCT 25.8 (L) 94.8 - 34.7 %   MCV 92.4 78.0 - 100.0 fL   MCH 30.7 26.0 - 34.0 pg   MCHC 33.2 30.0 - 36.0 g/dL   RDW 58.3 07.4 - 60.0 %   Platelets 256 150 - 400 K/uL  Basic metabolic panel     Status: Abnormal   Collection Time: 01/28/16  3:47 AM  Result Value Ref Range   Sodium 139 135 - 145 mmol/L   Potassium 4.1 3.5 - 5.1 mmol/L   Chloride 103 101 - 111 mmol/L   CO2 29 22 - 32 mmol/L   Glucose, Bld 141 (H) 65 - 99 mg/dL   BUN 7 6 - 20 mg/dL   Creatinine, Ser 2.98 0.61 - 1.24 mg/dL   Calcium 9.1 8.9 - 47.3 mg/dL   GFR calc non Af Amer >60 >60 mL/min   GFR calc Af Amer >60 >60 mL/min    Comment: (NOTE) The eGFR has been calculated using the CKD EPI equation. This calculation has not been validated in all clinical situations. eGFR's persistently <60 mL/min signify possible Chronic Kidney Disease.    Anion gap 7 5 - 15    Radiology/Results: Ct Abdomen Pelvis W Contrast  Result Date: 01/26/2016 CLINICAL DATA:  Perianal abscess, weakness, dizziness, prior MI EXAM: CT ABDOMEN AND PELVIS WITH CONTRAST TECHNIQUE: Multidetector CT imaging of the abdomen and pelvis was performed using the standard protocol following bolus administration of intravenous contrast. CONTRAST:  ISOVUE-300 IOPAMIDOL (ISOVUE-300) INJECTION 61% COMPARISON:  06/13/2015 FINDINGS: Lower chest: Lung bases are clear. Hepatobiliary: Liver is within normal limits. Gallbladder is unremarkable. No intrahepatic or extrahepatic ductal  dilatation. Pancreas: Within normal limits. Spleen: Within normal limits. Adrenals/Urinary Tract: Adrenal glands are within normal limits. Kidneys are within normal limits.  No hydronephrosis. Bladder is within normal limits. Stomach/Bowel: Stomach is within normal limits. No evidence of bowel obstruction. Normal appendix (series 2/image 55). Mild wall thickening along the lower rectum. Suspected fistulous communication with the rectum in the 5 o'clock position (series 2/image 37), just above the anorectal junction. Associated 2.6 x 4.5 x 4.3 cm perirectal abscess extending from 3:00 to 9:00 (series 2/image 35), previously 2.6 x 3.7 x 3.4 cm. Vascular/Lymphatic: No evidence of abdominal aortic aneurysm. No suspicious abdominopelvic lymphadenopathy. Reproductive: Prostate is unremarkable. Other: No abdominopelvic ascites. Musculoskeletal: Visualized osseous structures are within normal limits. 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 Associated 4.5 cm posterior perirectal abscess, increased. Electronically Signed   By: Charline Bills M.D.   On: 01/26/2016 12:09    Anti-infectives: Anti-infectives    Start     Dose/Rate Route Frequency Ordered Stop   01/27/16 1400  piperacillin-tazobactam (ZOSYN) IVPB 3.375 g     3.375 g 12.5 mL/hr over 240 Minutes Intravenous Every 8 hours 01/27/16 1343     01/26/16 1430  piperacillin-tazobactam (ZOSYN) IVPB 3.375 g  Status:  Discontinued     3.375 g 12.5 mL/hr over 240 Minutes Intravenous Every 8 hours 01/26/16 1415 01/27/16 1343      Assessment/Plan: Problem List: Patient Active Problem List   Diagnosis Date Noted  . Perirectal abscess 06/13/2015  . NSTEMI (non-ST elevated myocardial infarction) ([place]) 11/30/2013  . Elevated troponin     Cultures pending.  WBC remains elevated despite drainage.  Remove wicks tomorrow 1 Day Post-Op    LOS: 1 day   Matt B. Hassell Done, MD, [facility] [phone]  beeper [phone]  01/28/2016 10:03 AM

## 2016-01-29 LAB — CBC WITH DIFFERENTIAL/PLATELET
BASOS ABS: 0 10*3/uL (ref 0.0–0.1)
Basophils Relative: 0 %
EOS ABS: 0 10*3/uL (ref 0.0–0.7)
Eosinophils Relative: 0 %
HEMATOCRIT: 30.8 % — AB (ref 39.0–52.0)
Hemoglobin: 10.3 g/dL — ABNORMAL LOW (ref 13.0–17.0)
LYMPHS ABS: 3 10*3/uL (ref 0.7–4.0)
Lymphocytes Relative: 11 %
MCH: 30.6 pg (ref 26.0–34.0)
MCHC: 33.4 g/dL (ref 30.0–36.0)
MCV: 91.4 fL (ref 78.0–100.0)
MONOS PCT: 7 %
Monocytes Absolute: 1.9 10*3/uL — ABNORMAL HIGH (ref 0.1–1.0)
Neutro Abs: 22.1 10*3/uL — ABNORMAL HIGH (ref 1.7–7.7)
Neutrophils Relative %: 82 %
PLATELETS: 287 10*3/uL (ref 150–400)
RBC: 3.37 MIL/uL — AB (ref 4.22–5.81)
RDW: 13.8 % (ref 11.5–15.5)
WBC: 27 10*3/uL — AB (ref 4.0–10.5)

## 2016-01-29 LAB — BASIC METABOLIC PANEL
ANION GAP: 7 (ref 5–15)
BUN: 8 mg/dL (ref 6–20)
CALCIUM: 8.4 mg/dL — AB (ref 8.9–10.3)
CO2: 31 mmol/L (ref 22–32)
Chloride: 102 mmol/L (ref 101–111)
Creatinine, Ser: 0.85 mg/dL (ref 0.61–1.24)
GFR calc Af Amer: >60 mL/min (ref >60)
Glucose, Bld: 127 mg/dL — ABNORMAL HIGH (ref 65–99)
POTASSIUM: 3.7 mmol/L (ref 3.5–5.1)
SODIUM: 140 mmol/L (ref 135–145)

## 2016-01-29 MED ORDER — POLYETHYLENE GLYCOL 3350 17 G PO PACK: 17.0000 g | PACK | Freq: Every day | ORAL | 0 refills | Status: DC

## 2016-01-29 MED ORDER — POLYETHYLENE GLYCOL 3350 17 G PO PACK
17.0000 g | PACK | Freq: Every day | ORAL | Status: DC
  Administered 2016-01-29: 17 g via ORAL
  Filled 2016-01-29: qty 1

## 2016-01-29 MED ORDER — HYDROCODONE-ACETAMINOPHEN 5-325 MG PO TABS: 1.0000 | ORAL_TABLET | ORAL | 0 refills | Status: DC | PRN

## 2016-01-29 MED ORDER — KETOROLAC TROMETHAMINE 15 MG/ML IJ SOLN: 15.0000 mg | Freq: Four times a day (QID) | INTRAMUSCULAR | Status: DC | PRN

## 2016-01-29 MED ORDER — AMOXICILLIN-POT CLAVULANATE 875-125 MG PO TABS: 1.0000 | ORAL_TABLET | Freq: Two times a day (BID) | ORAL | 0 refills | Status: DC

## 2016-01-29 MED ORDER — MORPHINE SULFATE (PF) 2 MG/ML IV SOLN: 1.0000 mg | INTRAVENOUS | Status: DC | PRN

## 2016-01-29 MED ORDER — PANTOPRAZOLE SODIUM 40 MG PO TBEC: 40.0000 mg | DELAYED_RELEASE_TABLET | Freq: Every day | ORAL | Status: DC

## 2016-01-29 NOTE — Progress Notes (Signed)
Patient ID: Phillip [name], male   DOB: 1980/07/21, 35 y.o.   MRN: [id number]  [facility]  2 Days Post-Op  Subjective: Feeling a little better today. Continues to have some perirectal pain. Tolerating diet. Denies n/v. Afebrile. Hopes to go home today.  Objective: Vital signs in last 24 hours: Temp:  [97.9 F (36.6 C)-98.6 F (37 C)] 97.9 F (36.6 C) (12/07 40980638) Pulse Rate:  [78-79] 78 (12/07 0638) Resp:  [18] 18 (12/07 11910638) BP: (106-124)/(64-70) 106/66 (12/07 47820638) SpO2:  [98 %-100 %] 100 % (12/07 95620638) Last BM Date: 01/24/16  Intake/Output from previous day: 12/06 0701 - 12/07 0700 In: 1357.5 [P.O.:360; I.V.:847.5; IV Piggyback:150] Out: 1650 [Urine:1650] Intake/Output this shift: No intake/output data recorded.  PE: Gen:  Alert, NAD, pleasant Pulm:  Effort normal Abd: Soft, NT/ND Perineum: 3 wicks in place with no surrounding erythema or fluctuance, drainage noted on packing but no active drainage appreciated, no foul odor  Lab Results:   Recent Labs  01/28/16 0347 01/29/16 0344  WBC 31.8* 27.0*  HGB 11.7* 10.3*  HCT 35.2* 30.8*  PLT 256 287   BMET  Recent Labs  01/28/16 0347 01/29/16 0344  NA 139 140  K 4.1 3.7  CL 103 102  CO2 29 31  GLUCOSE 141* 127*  BUN 7 8  CREATININE 0.92 0.85  CALCIUM 9.1 8.4*   PT/INR No results for input(s): LABPROT, INR in the last 72 hours. CMP     Component Value Date/Time   NA 140 01/29/2016 0344   K 3.7 01/29/2016 0344   CL 102 01/29/2016 0344   CO2 31 01/29/2016 0344   GLUCOSE 127 (H) 01/29/2016 0344   BUN 8 01/29/2016 0344   CREATININE 0.85 01/29/2016 0344   CALCIUM 8.4 (L) 01/29/2016 0344   PROT 6.7 06/13/2015 1826   ALBUMIN 3.4 (L) 06/13/2015 1826   AST 15 06/13/2015 1826   ALT 14 (L) 06/13/2015 1826   ALKPHOS 83 06/13/2015 1826   BILITOT 1.1 06/13/2015 1826   GFRNONAA >60 01/29/2016 0344   GFRAA >60 01/29/2016 0344   Lipase  No results found for:  LIPASE     Studies/Results: No results found.  Anti-infectives: Anti-infectives    Start     Dose/Rate Route Frequency Ordered Stop   01/27/16 1400  piperacillin-tazobactam (ZOSYN) IVPB 3.375 g     3.375 g 12.5 mL/hr over 240 Minutes Intravenous Every 8 hours 01/27/16 1343     01/26/16 1430  piperacillin-tazobactam (ZOSYN) IVPB 3.375 g  Status:  Discontinued     3.375 g 12.5 mL/hr over 240 Minutes Intravenous Every 8 hours 01/26/16 1415 01/27/16 1343       Assessment/Plan Perirectal abscess, recurrent x3 S/p Incision and drainage of perirectal abscess 12/5 Dr. Daphine DeutscherMartin - POD 2 - WBC trending down, 27 from 31.8 - culture: MODERATE STREPTOCOCCUS GROUP C HOLDING FOR POSSIBLE ANAEROBE, susceptibilities pending  ABLA - Hg 10.3, monitor CAD with hx of MI/Coronary spasm  Tobacco use  ID - zosyn 12/4>> FEN - heart healthy VTE - heparin   Plan - packing removed and dry dressing applied. Start sitz baths. Continue zosyn for now and will follow susceptibilities. Limit pain medication to PO tablets. Likely ready for discharge later today.   LOS: 2 days    Edson SnowballBROOKE A MILLER , [facility] 01/29/2016, 10:13 AM Pager: [phone] Consults: [phone] Mon-Fri 7:00 am-4:30 pm Sat-Sun 7:00 am-11:30 am    Addendum: Rechecked patient this afternoon. Pain is well controlled  and he is ready for discharge once he learns how to do sitz bath. Will switch to PO augmentin for 10 days. Patient will follow-up with Dr. Daphine DeutscherMartin in 1-2 weeks.  BROOKE A MILLER

## 2016-01-29 NOTE — Discharge Instructions (Signed)
Disposable Sitz Bath °Introduction °A disposable sitz bath is a plastic basin that fits over the toilet. A bag is hung above the toilet, and the bag is connected to a tube that opens into the basin. The bag is filled with warm water that flows into the basin through the tube. A sitz bath can be used to help relieve symptoms, clean, and promote healing in the genital and anal areas, as well as in the lower abdomen and buttocks. °What are the risks? °Sitz baths are generally very safe. It is possible for the skin between the genitals and the anus (perineum) to become infected, but this is rare. You can avoid this by cleaning your sitz bath supplies thoroughly. °How to use a disposable sitz bath °1. Close the clamp on the tube. Make sure the clamp is closed tightly to prevent leakage. °2. Fill the sitz bath basin and the plastic bag with warm water. The water should be warm enough to be comfortable, but not hot. °3. Raise the toilet seat and place the filled basin on the toilet. Make sure the overflow opening is facing toward the back of the toilet. °¨ If you prefer, you may place the empty basin on the toilet first, and then use the plastic bag to fill the basin with warm water. °4. Hang the filled plastic bag overhead on a hook or towel rack close to the toilet. The bag should be higher than the toilet so that the water will flow down through the tube. °5. Attach the tube to the opening on the basin. Make sure that the tube is attached to the basin tightly to prevent leakage. °6. Sit on the basin and release the clamp. This will allow warm water to flow into the basin and flush the area around your genitals and anus. °7. Remain sitting on the basin for about 15-20 minutes, or as long as told by your health care provider. °8. Stand up and gently pat your skin dry. If directed, apply clean bandages (dressings) to the affected area as told by your health care provider. °9. Carefully remove the basin from the toilet seat  and tip the basin into the toilet to empty any remaining water. Empty any remaining water from the plastic bag into the toilet. Then, flush the toilet. °10. Wash the basin with warm water and soap. Let the basin air dry in the sink. You should also let the plastic bag and the tubing air dry. °11. Store the basin, tubing, and plastic bag in a clean, dry area. °12. Wash your hands with soap and water. If soap and water are not available, use hand sanitizer. °Contact a health care provider if: °· You have symptoms that get worse instead of better. °· You develop new skin irritation, redness, or swelling around your genitals or anus. °This information is not intended to replace advice given to you by your health care provider. Make sure you discuss any questions you have with your health care provider. °Document Released: 08/10/2011 Document Revised: 07/17/2015 Document Reviewed: 12/29/2014 °© 2017 Elsevier ° °

## 2016-02-01 LAB — AEROBIC/ANAEROBIC CULTURE W GRAM STAIN (SURGICAL/DEEP WOUND)

## 2016-02-01 LAB — AEROBIC/ANAEROBIC CULTURE (SURGICAL/DEEP WOUND)

## 2016-03-01 NOTE — Discharge Summary (Signed)
Physician Discharge Summary  Patient ID: Phillip [name] MRN: [id number] DOB/AGE: 1980-06-17 36 y.o.  Admit date: 01/26/2016 Discharge date: 03/01/2016  Admission Diagnoses:  Recurrent perirectal abscess  Discharge Diagnoses:  same  Active Problems:   Perirectal abscess   Surgery:  Incision and drainage of perirectal abscess  Discharged Condition: improved  Hospital Course:   Patient had I I & D of perirectal abscess (recurrent)  He got better and went home.    Consults: none  Significant Diagnostic Studies: noe    Discharge Exam: Blood pressure 107/62, pulse 78, temperature 97.7 F (36.5 C), temperature source Oral, resp. rate 16, height 5\' 5"  (1.651 m), weight 60 kg (132 lb 4.4 oz), SpO2 100 %. Packing removed by PAs prior to discharge  Disposition: 01-Home or Self Care  Discharge Instructions    Call MD for:  severe uncontrolled pain    Complete by:  As directed    Diet - low sodium heart healthy    Complete by:  As directed    Discharge wound care:    Complete by:  As directed    Sit in a tub of warm water after BMs and at least twice a day (or take a good shower)   Increase activity slowly    Complete by:  As directed      Allergies as of 01/29/2016   No Known Allergies     Medication List    TAKE these medications   amoxicillin-clavulanate 875-125 MG tablet Commonly known as:  AUGMENTIN Take 1 tablet by mouth 2 (two) times daily.   HYDROcodone-acetaminophen 5-325 MG tablet Commonly known as:  NORCO/VICODIN Take 1-2 tablets by mouth every 4 (four) hours as needed for moderate pain.   ibuprofen 200 MG tablet Commonly known as:  ADVIL,MOTRIN Take 400 mg by mouth every 6 (six) hours as needed for headache, mild pain or moderate pain.   polyethylene glycol packet Commonly known as:  MIRALAX / GLYCOLAX Take 17 g by mouth daily.      Follow-up Information    Luretha MurphyMatthew Nia Nathaniel MD Follow up.   Why:  Your appointment is 02/05/16 at 2pm. Please arrive 30  minutes prior to your appointment to fill out necessary paperwork. Contact information: [address] [place], [place]   [phone]          Signed: Whit Bruni B 03/01/2016, 7:08 AM

## 2017-09-08 ENCOUNTER — Other Ambulatory Visit: Payer: Self-pay

## 2017-09-08 ENCOUNTER — Encounter (HOSPITAL_COMMUNITY): Payer: Self-pay | Admitting: Emergency Medicine

## 2017-09-08 ENCOUNTER — Emergency Department (HOSPITAL_COMMUNITY)
Admission: EM | Admit: 2017-09-08 | Discharge: 2017-09-08 | Disposition: A | Discharge status: Home / Self Care | Payer: Self-pay | Attending: Emergency Medicine | Admitting: Emergency Medicine

## 2017-09-08 DIAGNOSIS — Z79899 Other long term (current) drug therapy: Secondary | ICD-10-CM | POA: Insufficient documentation

## 2017-09-08 DIAGNOSIS — F17210 Nicotine dependence, cigarettes, uncomplicated: Secondary | ICD-10-CM | POA: Insufficient documentation

## 2017-09-08 DIAGNOSIS — R220 Localized swelling, mass and lump, head: Secondary | ICD-10-CM

## 2017-09-08 DIAGNOSIS — K0889 Other specified disorders of teeth and supporting structures: Secondary | ICD-10-CM | POA: Insufficient documentation

## 2017-09-08 MED ORDER — CHLORHEXIDINE GLUCONATE 0.12% ORAL RINSE (MEDLINE KIT): 15.0000 mL | Freq: Two times a day (BID) | OROMUCOSAL | 0 refills | Status: DC

## 2017-09-08 MED ORDER — ACETAMINOPHEN 500 MG PO TABS
1000.0000 mg | ORAL_TABLET | Freq: Once | ORAL | Status: AC
  Administered 2017-09-08: 1000 mg via ORAL
  Filled 2017-09-08: qty 2

## 2017-09-08 MED ORDER — LIDOCAINE VISCOUS HCL 2 % MT SOLN: 15.0000 mL | OROMUCOSAL | 0 refills | Status: DC | PRN

## 2017-09-08 MED ORDER — PENICILLIN V POTASSIUM 500 MG PO TABS: 500.0000 mg | ORAL_TABLET | Freq: Four times a day (QID) | ORAL | 0 refills | Status: AC

## 2017-09-08 NOTE — ED Provider Notes (Signed)
[facility] DEPT Provider Note   CSN: [id number] Arrival date & time: 09/08/17  1130     History   Chief Complaint Chief Complaint  Patient presents with  . Facial Swelling  . Dental Pain    HPI Phillip [name] is a 37 y.o. male who presents to the emergency department with a chief complaint of left-sided facial swelling and pain.  Pain has been constant, severe and radiates up to the left inferior periorbital region for the last few days.  Today, he woke up and noticed some mild swelling to the left side of his face and cheek.  He denies fever or chills, sinus pain or pressure, otalgia, nasal congestion, URI symptoms, sore throat, neck pain or stiffness, headache, or pain with movement of the eyes.  He treated his symptoms with 800 mg of ibuprofen with no improvement.  He has not established with a dentist, but recently obtained dental insurance.  The history is provided by the patient. No language interpreter was used.  Dental Pain      Past Medical History:  Diagnosis Date  . Coronary vasospasm ([place])    a. NSTEMI 10/15 - LHC: no sig CAD;  b. Echo 10/15: EF 50-55%, inf HK  . History of acute inferior wall MI    a. LHC 12/08 - oRCA spasm; apical LAD occluded  . Hyperlipidemia   . Perianal abscess     Patient Active Problem List   Diagnosis Date Noted  . Perirectal abscess 06/13/2015  . NSTEMI (non-ST elevated myocardial infarction) ([place]) 11/30/2013  . Elevated troponin     Past Surgical History:  Procedure Laterality Date  . INCISION AND DRAINAGE PERIRECTAL ABSCESS N/A 06/13/2015   Procedure: IRRIGATION AND DEBRIDEMENT PERIRECTAL ABSCESS;  Surgeon: Alphonsa Overall, MD;  Location: [facility] ORS;  Service: General;  Laterality: N/A;  . INCISION AND DRAINAGE PERIRECTAL ABSCESS N/A 01/27/2016   Procedure: IRRIGATION AND DEBRIDEMENT PERIRECTAL ABSCESS;  Surgeon: Johnathan Hausen, MD;  Location: [facility] ORS;  Service: General;  Laterality: N/A;  . LEFT HEART  CATHETERIZATION WITH CORONARY ANGIOGRAM N/A 11/30/2013   Procedure: LEFT HEART CATHETERIZATION WITH CORONARY ANGIOGRAM;  Surgeon: Burnell Blanks, MD;  Location: [facility] CATH LAB;  Service: Cardiovascular;  Laterality: N/A;  . No previous surgeries          Home Medications    Prior to Admission medications   Medication Sig Start Date End Date Taking? Authorizing Provider  ibuprofen (ADVIL,MOTRIN) 200 MG tablet Take 400 mg by mouth every 6 (six) hours as needed for headache, mild pain or moderate pain.   Yes [provider]  chlorhexidine gluconate, MEDLINE KIT, (PERIDEX) 0.12 % solution Use as directed 15 mLs in the mouth or throat 2 (two) times daily. 09/08/17   Jaymason Ledesma A, PA-C  lidocaine (XYLOCAINE) 2 % solution Use as directed 15 mLs in the mouth or throat every 3 (three) hours as needed for mouth pain. 09/08/17   Dayquan Buys A, PA-C  penicillin v potassium (VEETID) 500 MG tablet Take 1 tablet (500 mg total) by mouth 4 (four) times daily for 5 days. 09/08/17 09/13/17  Franciszek Platten, Laymond Purser, PA-C    Family History Family History  Problem Relation Age of Onset  . Heart disease Mother        CHF  . Heart failure Mother     Social History Social History   Tobacco Use  . Smoking status: Current Every Day Smoker    Packs/day: 3.00    Types:  Cigars  . Smokeless tobacco: Never Used  Substance Use Topics  . Alcohol use: No  . Drug use: No     Allergies   Patient has no known allergies.   Review of Systems Review of Systems  Constitutional: Negative for appetite change and fever.  HENT: Positive for dental problem and facial swelling. Negative for sinus pressure, sinus pain, sore throat and trouble swallowing.   Eyes: Negative for visual disturbance.  Respiratory: Negative for shortness of breath.   Cardiovascular: Negative for chest pain.  Gastrointestinal: Negative for abdominal pain, nausea and vomiting.  Genitourinary: Negative for dysuria.  Musculoskeletal:  Negative for back pain.  Skin: Negative for rash.  Allergic/Immunologic: Negative for immunocompromised state.  Neurological: Negative for headaches.  Psychiatric/Behavioral: Negative for confusion.     Physical Exam Updated Vital Signs BP 119/84 (BP Location: Left Arm)   Pulse 86   Temp 97.8 F (36.6 C) (Oral)   Resp 16   Ht _0  (1.676 m)   Wt 59 kg (130 lb)   SpO2 100%   BMI 20.98 kg/m   Physical Exam  Constitutional: He appears well-developed. No distress.  HENT:  Head: Normocephalic.  Right Ear: Tympanic membrane, external ear and ear canal normal.  Left Ear: Tympanic membrane, external ear and ear canal normal.  Nose: No nasal septal hematoma. Right sinus exhibits no maxillary sinus tenderness and no frontal sinus tenderness. Left sinus exhibits maxillary sinus tenderness. Left sinus exhibits no frontal sinus tenderness.  Mouth/Throat: Uvula is midline, oropharynx is clear and moist and mucous membranes are normal. No trismus in the jaw. No dental abscesses or uvula swelling. No oropharyngeal exudate, posterior oropharyngeal edema or posterior oropharyngeal erythema. No tonsillar exudate.  Tooth 9 and tooth 10 are fractured to the gumline.  Minimal surrounding erythema to the gingiva.  There is a significant tartar and build up around tooth 14.  No purulent drainage is active or able to be expressed from the gingiva.  No trismus,.  No obvious dental abscess.  No sublingual induration or tenderness.  Mild edema noted to the left cheek.  No fluctuance and or induration noted.  Eyes: Pupils are equal, round, and reactive to light. Conjunctivae and EOM are normal. Right eye exhibits no discharge. Left eye exhibits no discharge.  Neck: Neck supple.  Cardiovascular: Normal rate, regular rhythm, normal heart sounds and intact distal pulses. Exam reveals no gallop and no friction rub.  No murmur heard. Pulmonary/Chest: Effort normal and breath sounds normal. No stridor. No  respiratory distress. He has no wheezes. He has no rales. He exhibits no tenderness.  Abdominal: Soft. He exhibits no distension.  Neurological: He is alert.  Skin: Skin is warm and dry.  Psychiatric: His behavior is normal.  Nursing note and vitals reviewed.    ED Treatments / Results  Labs (all labs ordered are listed, but only abnormal results are displayed) Labs Reviewed - No data to display  EKG None  Radiology No results found.  Procedures Procedures (including critical care time)  Medications Ordered in ED Medications  acetaminophen (TYLENOL) tablet 1,000 mg (1,000 mg Oral Given 09/08/17 1451)     Initial Impression / Assessment and Plan / ED Course  I have reviewed the triage vital signs and the nursing notes.  Pertinent labs & imaging results that were available during my care of the patient were reviewed by me and considered in my medical decision making (see chart for details).     37 year old patient with toothache  and mild left-sided facial swelling.  No constitutional symptoms.  No gross abscess.  Exam unconcerning for Ludwig's angina or spread of infection.  Will treat with penicillin, chlorhexidine oral wash, and pain medicine.  Urged patient to follow-up with dentist.  The patient recently established dental insurance.  Strict return precautions given.  He is hemodynamically stable and in no acute distress.  He is safe for discharge home at this time.  Final Clinical Impressions(s) / ED Diagnoses   Final diagnoses:  Dentalgia  Facial swelling    ED Discharge Orders        Ordered    lidocaine (XYLOCAINE) 2 % solution  Every  3 hours PRN     09/08/17 1445    chlorhexidine gluconate, MEDLINE KIT, (PERIDEX) 0.12 % solution  2 times daily     09/08/17 1445    penicillin v potassium (VEETID) 500 MG tablet  4 times daily     09/08/17 1445       Yeraldin Litzenberger, Laymond Purser, PA-C 09/08/17 1454    Jola Schmidt, MD 09/09/17 [phone]

## 2017-09-08 NOTE — ED Triage Notes (Signed)
Left swollen side of face after awakening. Pt states his tooth hurt yesterday but the pain has moved upward to his cheek

## 2017-09-08 NOTE — Discharge Instructions (Signed)
Thank you for allowing me to care for you today in the Emergency Department.   Take 800 mg of ibuprofen with food or thousand milligrams of Tylenol every 8 hours for pain control.  You can also alternate between these 2 medications and take a dose every 4 hours.  Swish and spit 15 mL of viscous lidocaine every 3 hours as needed for pain control.  Take 1 tablet of penicillin every 6 hours for the next 5 days.  This is an antibiotic and should also help with infection, pain, and swelling.  Use chlorhexidine wash as directed up to 2 times daily to help prevent spread of infection.  You should also gargle warm salt water and spit it out to help clean the area around the broken tooth clean to also help prevent spread of infection.  Please use your dental insurance card to help find a dentist or use the attached dental resource guide.  Most dentist office are not open on Friday, Saturday, or Sunday.  Return to the emergency department if you develop a high fever despite taking Tylenol and ibuprofen, if you become unable to open your mouth, if your voice becomes muffled, if you are drooling because you are physically unable to swallow your saliva, or develop other new, concerning symptoms.

## 2017-12-13 ENCOUNTER — Encounter (HOSPITAL_COMMUNITY): Payer: Self-pay | Admitting: Emergency Medicine

## 2017-12-13 ENCOUNTER — Emergency Department (HOSPITAL_COMMUNITY)
Admission: EM | Admit: 2017-12-13 | Discharge: 2017-12-13 | Disposition: A | Discharge status: Home / Self Care | Payer: Self-pay | Attending: Emergency Medicine | Admitting: Emergency Medicine

## 2017-12-13 DIAGNOSIS — Z79899 Other long term (current) drug therapy: Secondary | ICD-10-CM | POA: Insufficient documentation

## 2017-12-13 DIAGNOSIS — K047 Periapical abscess without sinus: Secondary | ICD-10-CM | POA: Insufficient documentation

## 2017-12-13 DIAGNOSIS — F17210 Nicotine dependence, cigarettes, uncomplicated: Secondary | ICD-10-CM | POA: Insufficient documentation

## 2017-12-13 MED ORDER — PENICILLIN V POTASSIUM 500 MG PO TABS: 500.0000 mg | ORAL_TABLET | Freq: Four times a day (QID) | ORAL | 0 refills | Status: AC

## 2017-12-13 MED ORDER — OXYCODONE-ACETAMINOPHEN 5-325 MG PO TABS: 1.0000 | ORAL_TABLET | Freq: Four times a day (QID) | ORAL | 0 refills | Status: DC | PRN

## 2017-12-13 NOTE — ED Notes (Signed)
See providers notes

## 2017-12-13 NOTE — ED Notes (Signed)
Patient verbalizes understanding of discharge instructions. Opportunity for questioning and answers were provided. Armband removed by staff, pt discharged from ED via POV 

## 2017-12-13 NOTE — ED Triage Notes (Signed)
Pt complains of left sided dental pain that started Sunday.  Pt reports swelling to left side of face.

## 2017-12-13 NOTE — ED Provider Notes (Signed)
[facility] EMERGENCY DEPARTMENT Provider Note   CSN: [id number] Arrival date & time: 12/13/17  2029   History   Chief Complaint Chief Complaint  Patient presents with  . Dental Pain    HPI Phillip [name] is a 37 y.o. male.  HPI   37 year old male presents today with complaints of dental pain.  Patient notes a history of ongoing pain to his upper dentition with numerous dental caries and fractured teeth.  He notes that until 3 days ago he was able to manage the symptoms with ibuprofen.  He notes it acutely worsened with swelling along the left upper anterior lateral gumline.  He denies any fever, denies any other swelling.  She reports he does not have a Pharmacist, community.  Past Medical History:  Diagnosis Date  . Coronary vasospasm ([place])    a. NSTEMI 10/15 - LHC: no sig CAD;  b. Echo 10/15: EF 50-55%, inf HK  . History of acute inferior wall MI    a. LHC 12/08 - oRCA spasm; apical LAD occluded  . Hyperlipidemia   . Perianal abscess     Patient Active Problem List   Diagnosis Date Noted  . Perirectal abscess 06/13/2015  . NSTEMI (non-ST elevated myocardial infarction) ([place]) 11/30/2013  . Elevated troponin     Past Surgical History:  Procedure Laterality Date  . INCISION AND DRAINAGE PERIRECTAL ABSCESS N/A 06/13/2015   Procedure: IRRIGATION AND DEBRIDEMENT PERIRECTAL ABSCESS;  Surgeon: Alphonsa Overall, MD;  Location: [facility] ORS;  Service: General;  Laterality: N/A;  . INCISION AND DRAINAGE PERIRECTAL ABSCESS N/A 01/27/2016   Procedure: IRRIGATION AND DEBRIDEMENT PERIRECTAL ABSCESS;  Surgeon: Johnathan Hausen, MD;  Location: [facility] ORS;  Service: General;  Laterality: N/A;  . LEFT HEART CATHETERIZATION WITH CORONARY ANGIOGRAM N/A 11/30/2013   Procedure: LEFT HEART CATHETERIZATION WITH CORONARY ANGIOGRAM;  Surgeon: Burnell Blanks, MD;  Location[facility] CATH LAB;  Service: Cardiovascular;  Laterality: N/A;  . No previous surgeries          Home Medications    Prior to  Admission medications   Medication Sig Start Date End Date Taking? Authorizing Provider  chlorhexidine gluconate, MEDLINE KIT, (PERIDEX) 0.12 % solution Use as directed 15 mLs in the mouth or throat 2 (two) times daily. 09/08/17   McDonald, Mia A, PA-C  ibuprofen (ADVIL,MOTRIN) 200 MG tablet Take 400 mg by mouth every 6 (six) hours as needed for headache, mild pain or moderate pain.    [provider]  lidocaine (XYLOCAINE) 2 % solution Use as directed 15 mLs in the mouth or throat every 3 (three) hours as needed for mouth pain. 09/08/17   McDonald, Mia A, PA-C  oxyCODONE-acetaminophen (PERCOCET/ROXICET) 5-325 MG tablet Take 1 tablet by mouth every 6 (six) hours as needed for severe pain. 12/13/17   John Vasconcelos, Dellis Filbert, PA-C  penicillin v potassium (VEETID) 500 MG tablet Take 1 tablet (500 mg total) by mouth 4 (four) times daily for 10 days. 12/13/17 12/23/17  Okey Regal, PA-C    Family History Family History  Problem Relation Age of Onset  . Heart disease Mother        CHF  . Heart failure Mother     Social History Social History   Tobacco Use  . Smoking status: Current Every Day Smoker    Packs/day: 3.00    Types: Cigars  . Smokeless tobacco: Never Used  Substance Use Topics  . Alcohol use: No  . Drug use: No     Allergies  Patient has no known allergies.   Review of Systems Review of Systems  All other systems reviewed and are negative.    Physical Exam Updated Vital Signs BP 123/76 (BP Location: Right Arm)   Pulse (!) 109   Temp 98.7 F (37.1 C) (Oral)   Resp 16   SpO2 99%   Physical Exam  Constitutional: He is oriented to person, place, and time. He appears well-developed and well-nourished.  HENT:  Head: Normocephalic and atraumatic.  Significant dental caries, decay and fractured teeth throughout, left anterior upper gumline around tooth with swelling, no fluctuance-no obvious facial swelling  Eyes: Pupils are equal, round, and reactive to light.  Conjunctivae are normal. Right eye exhibits no discharge. Left eye exhibits no discharge. No scleral icterus.  Neck: Normal range of motion. No JVD present. No tracheal deviation present.  Pulmonary/Chest: Effort normal. No stridor.  Neurological: He is alert and oriented to person, place, and time. Coordination normal.  Psychiatric: He has a normal mood and affect. His behavior is normal. Judgment and thought content normal.  Nursing note and vitals reviewed.    ED Treatments / Results  Labs (all labs ordered are listed, but only abnormal results are displayed) Labs Reviewed - No data to display  EKG None  Radiology No results found.  Procedures Procedures (including critical care time)  Medications Ordered in ED Medications - No data to display   Initial Impression / Assessment and Plan / ED Course  I have reviewed the triage vital signs and the nursing notes.  Pertinent labs & imaging results that were available during my care of the patient were reviewed by me and considered in my medical decision making (see chart for details).     37 year old male presents today with dental infection, no appreciable abscess.  No fever.  Patient started on antibiotics, pain medication outpatient follow-up, he is given strict return precautions, he verbalized understanding and agreement to today's plan had no further questions or concerns.  Final Clinical Impressions(s) / ED Diagnoses   Final diagnoses:  Dental infection    ED Discharge Orders         Ordered    oxyCODONE-acetaminophen (PERCOCET/ROXICET) 5-325 MG tablet  Every 6 hours PRN     12/13/17 2103    penicillin v potassium (VEETID) 500 MG tablet  4 times daily     12/13/17 2104           Francee Gentile 12/13/17 2118    Jola Schmidt, MD 12/13/17 2237

## 2017-12-13 NOTE — Discharge Instructions (Addendum)
Please read attached information. If you experience any new or worsening signs or symptoms please return to the emergency room for evaluation. Please follow-up with your primary care provider or specialist as discussed. Please use medication prescribed only as directed and discontinue taking if you have any concerning signs or symptoms.   °

## 2018-07-27 ENCOUNTER — Emergency Department (HOSPITAL_COMMUNITY): Payer: Self-pay

## 2018-07-27 ENCOUNTER — Observation Stay (HOSPITAL_COMMUNITY): Payer: Self-pay | Admitting: Anesthesiology

## 2018-07-27 ENCOUNTER — Encounter (HOSPITAL_COMMUNITY): Payer: Self-pay | Admitting: Student

## 2018-07-27 ENCOUNTER — Encounter (HOSPITAL_COMMUNITY)
Admission: EM | Disposition: A | Discharge status: Home / Self Care | Payer: Self-pay | Source: Home / Self Care | Attending: Emergency Medicine

## 2018-07-27 ENCOUNTER — Other Ambulatory Visit: Payer: Self-pay

## 2018-07-27 ENCOUNTER — Observation Stay (HOSPITAL_COMMUNITY)
Admission: EM | Admit: 2018-07-27 | Discharge: 2018-07-28 | Disposition: A | Discharge status: Home / Self Care | Payer: Self-pay | Attending: General Surgery | Admitting: General Surgery

## 2018-07-27 ENCOUNTER — Inpatient Hospital Stay: Admit: 2018-07-27 | Payer: Self-pay | Admitting: General Surgery

## 2018-07-27 DIAGNOSIS — Z1159 Encounter for screening for other viral diseases: Secondary | ICD-10-CM | POA: Insufficient documentation

## 2018-07-27 DIAGNOSIS — F17290 Nicotine dependence, other tobacco product, uncomplicated: Secondary | ICD-10-CM | POA: Insufficient documentation

## 2018-07-27 DIAGNOSIS — I252 Old myocardial infarction: Secondary | ICD-10-CM | POA: Insufficient documentation

## 2018-07-27 DIAGNOSIS — K610 Anal abscess: Principal | ICD-10-CM | POA: Insufficient documentation

## 2018-07-27 DIAGNOSIS — I2510 Atherosclerotic heart disease of native coronary artery without angina pectoris: Secondary | ICD-10-CM | POA: Insufficient documentation

## 2018-07-27 DIAGNOSIS — K611 Rectal abscess: Secondary | ICD-10-CM | POA: Diagnosis present

## 2018-07-27 HISTORY — PX: INCISION AND DRAINAGE PERIRECTAL ABSCESS: SHX1804

## 2018-07-27 LAB — BASIC METABOLIC PANEL
Anion gap: 5 (ref 5–15)
BUN: 12 mg/dL (ref 6–20)
CO2: 25 mmol/L (ref 22–32)
Calcium: 8.2 mg/dL — ABNORMAL LOW (ref 8.9–10.3)
Chloride: 108 mmol/L (ref 98–111)
Creatinine, Ser: 0.96 mg/dL (ref 0.61–1.24)
GFR calc Af Amer: >60 mL/min (ref >60)
GFR calc non Af Amer: >60 mL/min (ref >60)
Glucose, Bld: 85 mg/dL (ref 70–99)
Potassium: 3.4 mmol/L — ABNORMAL LOW (ref 3.5–5.1)
Sodium: 138 mmol/L (ref 135–145)

## 2018-07-27 LAB — CBC WITH DIFFERENTIAL/PLATELET
Abs Immature Granulocytes: 0.06 10*3/uL (ref 0.00–0.07)
Basophils Absolute: 0.1 10*3/uL (ref 0.0–0.1)
Basophils Relative: 1 %
Eosinophils Absolute: 0.3 10*3/uL (ref 0.0–0.5)
Eosinophils Relative: 3 %
HCT: 40.1 % (ref 39.0–52.0)
Hemoglobin: 13.4 g/dL (ref 13.0–17.0)
Immature Granulocytes: 1 %
Lymphocytes Relative: 18 %
Lymphs Abs: 2.4 10*3/uL (ref 0.7–4.0)
MCH: 32.5 pg (ref 26.0–34.0)
MCHC: 33.4 g/dL (ref 30.0–36.0)
MCV: 97.3 fL (ref 80.0–100.0)
Monocytes Absolute: 1.2 10*3/uL — ABNORMAL HIGH (ref 0.1–1.0)
Monocytes Relative: 10 %
Neutro Abs: 9 10*3/uL — ABNORMAL HIGH (ref 1.7–7.7)
Neutrophils Relative %: 67 %
Platelets: 211 10*3/uL (ref 150–400)
RBC: 4.12 MIL/uL — ABNORMAL LOW (ref 4.22–5.81)
RDW: 14 % (ref 11.5–15.5)
WBC: 13.1 10*3/uL — ABNORMAL HIGH (ref 4.0–10.5)
nRBC: 0 % (ref 0.0–0.2)

## 2018-07-27 LAB — SARS CORONAVIRUS 2 BY RT PCR (HOSPITAL ORDER, PERFORMED IN ~~LOC~~ HOSPITAL LAB): SARS Coronavirus 2: NEGATIVE

## 2018-07-27 SURGERY — INCISION AND DRAINAGE, ABSCESS, PERIRECTAL
Anesthesia: General | Site: Anus

## 2018-07-27 MED ORDER — ONDANSETRON 4 MG PO TBDP: 4.0000 mg | ORAL_TABLET | Freq: Four times a day (QID) | ORAL | Status: DC | PRN

## 2018-07-27 MED ORDER — OXYCODONE HCL 5 MG PO TABS: 5.0000 mg | ORAL_TABLET | Freq: Once | ORAL | Status: DC | PRN

## 2018-07-27 MED ORDER — ONDANSETRON HCL 4 MG/2ML IJ SOLN: 4.0000 mg | Freq: Four times a day (QID) | INTRAMUSCULAR | Status: DC | PRN

## 2018-07-27 MED ORDER — MIDAZOLAM HCL 5 MG/5ML IJ SOLN
INTRAMUSCULAR | Status: DC | PRN
  Administered 2018-07-27: 2 mg via INTRAVENOUS

## 2018-07-27 MED ORDER — MIDAZOLAM HCL 2 MG/2ML IJ SOLN
INTRAMUSCULAR | Status: AC
  Filled 2018-07-27: qty 2

## 2018-07-27 MED ORDER — LIDOCAINE 2% (20 MG/ML) 5 ML SYRINGE
INTRAMUSCULAR | Status: AC
  Filled 2018-07-27: qty 5

## 2018-07-27 MED ORDER — HYDROMORPHONE HCL 1 MG/ML IJ SOLN
1.0000 mg | INTRAMUSCULAR | Status: DC | PRN
  Administered 2018-07-27 – 2018-07-28 (×3): 2 mg via INTRAVENOUS
  Filled 2018-07-27 (×3): qty 2

## 2018-07-27 MED ORDER — CIPROFLOXACIN IN D5W 400 MG/200ML IV SOLN
400.0000 mg | Freq: Once | INTRAVENOUS | Status: AC
  Administered 2018-07-27: 400 mg via INTRAVENOUS
  Filled 2018-07-27: qty 200

## 2018-07-27 MED ORDER — DIPHENHYDRAMINE HCL 25 MG PO CAPS: 25.0000 mg | ORAL_CAPSULE | Freq: Four times a day (QID) | ORAL | Status: DC | PRN

## 2018-07-27 MED ORDER — IOHEXOL 300 MG/ML  SOLN
100.0000 mL | Freq: Once | INTRAMUSCULAR | Status: AC | PRN
  Administered 2018-07-27: 100 mL via INTRAVENOUS

## 2018-07-27 MED ORDER — METRONIDAZOLE IN NACL 5-0.79 MG/ML-% IV SOLN
500.0000 mg | Freq: Once | INTRAVENOUS | Status: AC
  Administered 2018-07-27: 500 mg via INTRAVENOUS
  Filled 2018-07-27: qty 100

## 2018-07-27 MED ORDER — ONDANSETRON HCL 4 MG/2ML IJ SOLN: 4.0000 mg | Freq: Once | INTRAMUSCULAR | Status: DC

## 2018-07-27 MED ORDER — MORPHINE SULFATE (PF) 4 MG/ML IV SOLN: 4.0000 mg | Freq: Once | INTRAVENOUS | Status: DC

## 2018-07-27 MED ORDER — DEXTROSE-NACL 5-0.9 % IV SOLN
INTRAVENOUS | Status: DC
  Administered 2018-07-27: 20:00:00 via INTRAVENOUS

## 2018-07-27 MED ORDER — HYDROMORPHONE HCL 1 MG/ML IJ SOLN: 0.2500 mg | INTRAMUSCULAR | Status: DC | PRN

## 2018-07-27 MED ORDER — FENTANYL CITRATE (PF) 100 MCG/2ML IJ SOLN
INTRAMUSCULAR | Status: AC
  Filled 2018-07-27: qty 2

## 2018-07-27 MED ORDER — PROMETHAZINE HCL 25 MG/ML IJ SOLN: 6.2500 mg | INTRAMUSCULAR | Status: DC | PRN

## 2018-07-27 MED ORDER — PROPOFOL 10 MG/ML IV BOLUS
INTRAVENOUS | Status: AC
  Filled 2018-07-27: qty 20

## 2018-07-27 MED ORDER — LACTATED RINGERS IV SOLN
INTRAVENOUS | Status: DC
  Administered 2018-07-27: 17:00:00 via INTRAVENOUS

## 2018-07-27 MED ORDER — LIDOCAINE 2% (20 MG/ML) 5 ML SYRINGE
INTRAMUSCULAR | Status: DC | PRN
  Administered 2018-07-27: 75 mg via INTRAVENOUS

## 2018-07-27 MED ORDER — METRONIDAZOLE IN NACL 5-0.79 MG/ML-% IV SOLN
INTRAVENOUS | Status: AC
  Filled 2018-07-27: qty 100

## 2018-07-27 MED ORDER — DIPHENHYDRAMINE HCL 50 MG/ML IJ SOLN: 25.0000 mg | Freq: Four times a day (QID) | INTRAMUSCULAR | Status: DC | PRN

## 2018-07-27 MED ORDER — FENTANYL CITRATE (PF) 100 MCG/2ML IJ SOLN
50.0000 ug | Freq: Once | INTRAMUSCULAR | Status: AC
  Administered 2018-07-27: 50 ug via INTRAVENOUS
  Filled 2018-07-27: qty 2

## 2018-07-27 MED ORDER — ONDANSETRON HCL 4 MG/2ML IJ SOLN
INTRAMUSCULAR | Status: AC
  Filled 2018-07-27: qty 2

## 2018-07-27 MED ORDER — FENTANYL CITRATE (PF) 100 MCG/2ML IJ SOLN
INTRAMUSCULAR | Status: DC | PRN
  Administered 2018-07-27: 100 ug via INTRAVENOUS

## 2018-07-27 MED ORDER — SODIUM CHLORIDE (PF) 0.9 % IJ SOLN
INTRAMUSCULAR | Status: AC
  Filled 2018-07-27: qty 50

## 2018-07-27 MED ORDER — PROPOFOL 10 MG/ML IV BOLUS
INTRAVENOUS | Status: DC | PRN
  Administered 2018-07-27: 200 mg via INTRAVENOUS

## 2018-07-27 MED ORDER — HYDRALAZINE HCL 20 MG/ML IJ SOLN: 10.0000 mg | INTRAMUSCULAR | Status: DC | PRN

## 2018-07-27 MED ORDER — OXYCODONE HCL 5 MG PO TABS: 5.0000 mg | ORAL_TABLET | ORAL | Status: DC | PRN

## 2018-07-27 MED ORDER — ACETAMINOPHEN 10 MG/ML IV SOLN
INTRAVENOUS | Status: DC | PRN
  Administered 2018-07-27: 1000 mg via INTRAVENOUS

## 2018-07-27 MED ORDER — DEXAMETHASONE SODIUM PHOSPHATE 10 MG/ML IJ SOLN
INTRAMUSCULAR | Status: AC
  Filled 2018-07-27: qty 1

## 2018-07-27 MED ORDER — OXYCODONE HCL 5 MG/5ML PO SOLN: 5.0000 mg | Freq: Once | ORAL | Status: DC | PRN

## 2018-07-27 MED ORDER — ONDANSETRON HCL 4 MG/2ML IJ SOLN
4.0000 mg | Freq: Four times a day (QID) | INTRAMUSCULAR | Status: DC | PRN
  Administered 2018-07-27 – 2018-07-28 (×2): 4 mg via INTRAVENOUS
  Filled 2018-07-27 (×2): qty 2

## 2018-07-27 MED ORDER — DEXAMETHASONE SODIUM PHOSPHATE 10 MG/ML IJ SOLN
INTRAMUSCULAR | Status: DC | PRN
  Administered 2018-07-27: 10 mg via INTRAVENOUS

## 2018-07-27 MED ORDER — NICOTINE 21 MG/24HR TD PT24
21.0000 mg | MEDICATED_PATCH | Freq: Every day | TRANSDERMAL | Status: DC
  Administered 2018-07-27: 21 mg via TRANSDERMAL
  Filled 2018-07-27: qty 1

## 2018-07-27 MED ORDER — ONDANSETRON HCL 4 MG/2ML IJ SOLN
INTRAMUSCULAR | Status: DC | PRN
  Administered 2018-07-27: 4 mg via INTRAVENOUS

## 2018-07-27 MED ORDER — ACETAMINOPHEN 10 MG/ML IV SOLN
INTRAVENOUS | Status: AC
  Filled 2018-07-27: qty 100

## 2018-07-27 SURGICAL SUPPLY — 24 items
BLADE SURG 15 STRL LF DISP TIS (BLADE) ×1 IMPLANT
BLADE SURG 15 STRL SS (BLADE) ×2
CLEANER TIP ELECTROSURG 2X2 (MISCELLANEOUS) IMPLANT
COVER WAND RF STERILE (DRAPES) IMPLANT
DERMABOND ADVANCED (GAUZE/BANDAGES/DRESSINGS) ×2
DERMABOND ADVANCED .7 DNX12 (GAUZE/BANDAGES/DRESSINGS) ×1 IMPLANT
DRSG PAD ABDOMINAL 8X10 ST (GAUZE/BANDAGES/DRESSINGS) ×3 IMPLANT
ELECT PENCIL ROCKER SW 15FT (MISCELLANEOUS) ×3 IMPLANT
ELECT REM PT RETURN 15FT ADLT (MISCELLANEOUS) IMPLANT
GAUZE 4X4 16PLY RFD (DISPOSABLE) ×3 IMPLANT
GAUZE PACKING IODOFORM 1/4X15 (GAUZE/BANDAGES/DRESSINGS) ×3 IMPLANT
GAUZE PACKING IODOFORM 1X5 (MISCELLANEOUS) IMPLANT
GAUZE SPONGE 4X4 12PLY STRL (GAUZE/BANDAGES/DRESSINGS) ×3 IMPLANT
GLOVE BIO SURGEON STRL SZ7.5 (GLOVE) ×3 IMPLANT
GOWN STRL REUS W/TWL LRG LVL3 (GOWN DISPOSABLE) ×6 IMPLANT
KIT BASIN OR (CUSTOM PROCEDURE TRAY) ×3 IMPLANT
KIT TURNOVER KIT A (KITS) IMPLANT
NS IRRIG 1000ML POUR BTL (IV SOLUTION) ×3 IMPLANT
PACK LITHOTOMY IV (CUSTOM PROCEDURE TRAY) ×3 IMPLANT
SWAB COLLECTION DEVICE MRSA (MISCELLANEOUS) ×3 IMPLANT
SWAB CULTURE ESWAB REG 1ML (MISCELLANEOUS) ×3 IMPLANT
TOWEL OR 17X26 10 PK STRL BLUE (TOWEL DISPOSABLE) ×3 IMPLANT
UNDERPAD 30X30 (UNDERPADS AND DIAPERS) ×3 IMPLANT
YANKAUER SUCT BULB TIP NO VENT (SUCTIONS) ×3 IMPLANT

## 2018-07-27 NOTE — Anesthesia Procedure Notes (Signed)
Procedure Name: LMA Insertion Date/Time: 07/27/2018 5:36 PM Performed by: Elyn Peers, CRNA Pre-anesthesia Checklist: Patient identified, Emergency Drugs available, Suction available, Patient being monitored and Timeout performed Patient Re-evaluated:Patient Re-evaluated prior to induction Oxygen Delivery Method: Circle system utilized Preoxygenation: Pre-oxygenation with 100% oxygen Induction Type: IV induction LMA: LMA inserted LMA Size: 5.0 Number of attempts: 1 Placement Confirmation: positive ETCO2 and breath sounds checked- equal and bilateral Tube secured with: Tape Dental Injury: Teeth and Oropharynx as per pre-operative assessment  Comments: Poor dentition noted prior to placement of LMA.   LMA placed atraumatically.

## 2018-07-27 NOTE — ED Notes (Signed)
OR here to transport pt, personal property and consent followed pt.

## 2018-07-27 NOTE — ED Notes (Signed)
Pt reports perianal abscess that started last Wednesday. Currently reporting pain 8/10. Denies fever.

## 2018-07-27 NOTE — Op Note (Signed)
07/27/2018  5:50 PM  PATIENT:  Phillip [name]  38 y.o. male  PRE-OPERATIVE DIAGNOSIS:  peri-rectal abscess  POST-OPERATIVE DIAGNOSIS:  peri-rectal abscess  PROCEDURE:  Procedure(s): EUA IRRIGATION AND DEBRIDEMENT PERIRECTAL ABSCESS (N/A)  SURGEON:  Surgeon(s) and Role:    * Axel Filler, MD - Primary  ANESTHESIA:   general  EBL:  minimal   BLOOD ADMINISTERED:none  DRAINS: 1.4" iodoform guaze   LOCAL MEDICATIONS USED:  NONE  SPECIMEN:  Source of Specimen:  Perianal abscess  DISPOSITION OF SPECIMEN:  PATHOLOGY  COUNTS:  YES  TOURNIQUET:  * No tourniquets in log *  DICTATION: .Dragon Dictation Indication for procedure: Patient is a 38 year old male with a right-sided perirectal/perianal abscess.  This was seen on CT scan.  Secondary to pain patient was taken back to the OR for exam under anesthesia and I&D.  Details of procedure: After the patient was consented he was taken back to the OR and placed in the supine position with bilateral SCDs in place.  He underwent general endotracheal intubation.  He was placed in lithotomy position.  Patient was prepped and draped standard fashion.  A timeout was called and all facts verified.    I proceeded with digital rectal exam.  There is no masses within the rectal vault.  At this time at approximately 9 o'clock position lithotomy position there appear to be an area of previous abscess drainage.  This appeared to be the area of fluctuance.  This appeared to be more superficial than near the rectum.  At this time an 18-gauge needle was used to aspirate and large amount of purulence was expressed.  This was sent off for culture.  At this time 11 blade was used to make a parallel incision in this area.  There is a cavity approximately 2 x 3 cm in size.  This was bluntly broken up.  This was then packed with quarter inch iodoform gauze.  The wound site was then dressed with 4 x 4's, ABD pad, and mesh panties.  Patient tolerated the  procedure well was taken to the recovery in stable condition.  PLAN OF CARE: Admit for overnight observation  PATIENT DISPOSITION:  PACU - hemodynamically stable.   Delay start of Pharmacological VTE agent (>24hrs) due to surgical blood loss or risk of bleeding: not applicable

## 2018-07-27 NOTE — ED Provider Notes (Signed)
[facility] DEPT Provider Note   CSN: [id number] Arrival date & time: 07/27/18  8916  History   Chief Complaint Chief Complaint  Patient presents with   Abscess    perianal     HPI Phillip [name] is a 38 y.o. male w/ a hx of prior perirectal abscesses requiring OR I&D x 2 who presents to the ED with concern for re-occurrence of perirectal abscess x 1 week. Patient states he is having pain & swelling to the R side of the rectum that is constant & progressively worsening. Current pain is a 7/10 in severity, worse with BM or movement, alleviated w/ ibuprofen some (took 800 mg 1 hour PTA). Denies fever, chills, nausea, vomiting, abdominal pain, melena, or hematochezia.      HPI  Past Medical History:  Diagnosis Date   Coronary vasospasm ([place])    a. NSTEMI 10/15 - LHC: no sig CAD;  b. Echo 10/15: EF 50-55%, inf HK   History of acute inferior wall MI    a. LHC 12/08 - oRCA spasm; apical LAD occluded   Hyperlipidemia    Perianal abscess     Patient Active Problem List   Diagnosis Date Noted   Perirectal abscess 06/13/2015   NSTEMI (non-ST elevated myocardial infarction) ([place]) 11/30/2013   Elevated troponin     Past Surgical History:  Procedure Laterality Date   INCISION AND DRAINAGE PERIRECTAL ABSCESS N/A 06/13/2015   Procedure: IRRIGATION AND DEBRIDEMENT PERIRECTAL ABSCESS;  Surgeon: Alphonsa Overall, MD;  Location: [facility] ORS;  Service: General;  Laterality: N/A;   INCISION AND DRAINAGE PERIRECTAL ABSCESS N/A 01/27/2016   Procedure: IRRIGATION AND DEBRIDEMENT PERIRECTAL ABSCESS;  Surgeon: Johnathan Hausen, MD;  Location: [facility] ORS;  Service: General;  Laterality: N/A;   LEFT HEART CATHETERIZATION WITH CORONARY ANGIOGRAM N/A 11/30/2013   Procedure: LEFT HEART CATHETERIZATION WITH CORONARY ANGIOGRAM;  Surgeon: Burnell Blanks, MD;  Location: [facility] CATH LAB;  Service: Cardiovascular;  Laterality: N/A;   No previous surgeries          Home  Medications    Prior to Admission medications   Medication Sig Start Date End Date Taking? Authorizing Provider  chlorhexidine gluconate, MEDLINE KIT, (PERIDEX) 0.12 % solution Use as directed 15 mLs in the mouth or throat 2 (two) times daily. 09/08/17   McDonald, Mia A, PA-C  ibuprofen (ADVIL,MOTRIN) 200 MG tablet Take 400 mg by mouth every 6 (six) hours as needed for headache, mild pain or moderate pain.    [provider]  lidocaine (XYLOCAINE) 2 % solution Use as directed 15 mLs in the mouth or throat every 3 (three) hours as needed for mouth pain. 09/08/17   McDonald, Mia A, PA-C  oxyCODONE-acetaminophen (PERCOCET/ROXICET) 5-325 MG tablet Take 1 tablet by mouth every 6 (six) hours as needed for severe pain. 12/13/17   Hedges, Dellis Filbert, PA-C    Family History Family History  Problem Relation Age of Onset   Heart disease Mother        CHF   Heart failure Mother     Social History Social History   Tobacco Use   Smoking status: Current Every Day Smoker    Packs/day: 3.00    Types: Cigars   Smokeless tobacco: Never Used  Substance Use Topics   Alcohol use: No   Drug use: No     Allergies   Patient has no known allergies.   Review of Systems Review of Systems  Constitutional: Negative for chills and fever.  Respiratory: Negative  for shortness of breath.   Cardiovascular: Negative for chest pain.  Gastrointestinal: Positive for rectal pain. Negative for abdominal pain, anal bleeding, blood in stool, constipation, diarrhea, nausea and vomiting.  Genitourinary: Negative for dysuria.  All other systems reviewed and are negative.    Physical Exam Updated Vital Signs BP 110/77 (BP Location: Left Arm)    Pulse 92    Temp 98.5 F (36.9 C) (Oral)    Resp 16    Ht '5\' 6"'$  (1.676 m)    Wt 59 kg    SpO2 100%    BMI 20.98 kg/m   Physical Exam Vitals signs and nursing note reviewed. Exam conducted with a chaperone present.  Constitutional:      General: He is not in  acute distress.    Appearance: He is well-developed. He is not toxic-appearing.  HENT:     Head: Normocephalic and atraumatic.  Eyes:     General:        Right eye: No discharge.        Left eye: No discharge.     Conjunctiva/sclera: Conjunctivae normal.  Neck:     Musculoskeletal: Neck supple.  Cardiovascular:     Rate and Rhythm: Normal rate and regular rhythm.  Pulmonary:     Effort: Pulmonary effort is normal. No respiratory distress.     Breath sounds: Normal breath sounds. No wheezing, rhonchi or rales.  Abdominal:     General: There is no distension.     Palpations: Abdomen is soft.     Tenderness: There is no abdominal tenderness. There is no guarding or rebound.  Genitourinary:    Rectum: No anal fissure or external hemorrhoid.       Comments: EDT present as chaperone.  Pain w/ DRE. No melena/gross blood.  Skin:    General: Skin is warm and dry.     Findings: No rash.  Neurological:     Mental Status: He is alert.     Comments: Clear speech.   Psychiatric:        Behavior: Behavior normal.    ED Treatments / Results  Labs (all labs ordered are listed, but only abnormal results are displayed) Labs Reviewed  CBC WITH DIFFERENTIAL/PLATELET - Abnormal; Notable for the following components:      Result Value   WBC 13.1 (*)    RBC 4.12 (*)    Neutro Abs 9.0 (*)    Monocytes Absolute 1.2 (*)    All other components within normal limits  BASIC METABOLIC PANEL - Abnormal; Notable for the following components:   Potassium 3.4 (*)    Calcium 8.2 (*)    All other components within normal limits  SARS CORONAVIRUS 2 (HOSPITAL ORDER, [place] [facility])  HIV ANTIBODY (ROUTINE TESTING W REFLEX)    EKG None  Radiology Ct Pelvis W Contrast  Result Date: 07/27/2018 CLINICAL DATA:  Perianal pain for 1 week EXAM: CT PELVIS WITH CONTRAST TECHNIQUE: Multidetector CT imaging of the pelvis was performed using the standard protocol following the bolus  administration of intravenous contrast. CONTRAST:  16m OMNIPAQUE IOHEXOL 300 MG/ML  SOLN COMPARISON:  01/26/2016 FINDINGS: Urinary Tract:  Bladder is decompressed. Bowel: Visualized small bowel and proximal colon are within normal limits. Adjacent to the posterolateral aspect of the rectum on the right there is a 2.8 cm peripherally enhancing fluid attenuation lesion consistent with a perirectal abscess. Just superior to this there is a focus of curvilinear increased attenuation identified which was not  seen on prior examinations. Vascular/Lymphatic: Atherosclerotic calcifications are noted without aneurysmal dilatation. No significant lymphadenopathy is noted. Reproductive:  Prostate is within normal limits. Other:  No other focal fluid collection is noted. Musculoskeletal: Bony structures appear within normal limits. IMPRESSION: Peripherally enhancing fluid collection along the lateral and posterior aspect of the rectum on the right consistent with perirectal abscess. A small curvilinear density is noted adjacent to this fluid collection superiorly which was not present on the prior exam. This may be related to prior procedure. Clinical correlation is recommended. Electronically Signed   By: Inez Catalina M.D.   On: 07/27/2018 12:13    Procedures Procedures (including critical care time)  Medications Ordered in ED Medications  fentaNYL (SUBLIMAZE) injection 50 mcg (has no administration in time range)     Initial Impression / Assessment and Plan / ED Course  I have reviewed the triage vital signs and the nursing notes.  Pertinent labs & imaging results that were available during my care of the patient were reviewed by me and considered in my medical decision making (see chart for details).   Patient presents w/ rectal pain x 1 week. Hx of perirectal abscess requiring OR I&D x 2. Nontoxic appearing, no apparent distress, vitals WNL. Exam w/ mild induration & tenderness to palpation to the R of the  anus, pain w/ DRE. No obvious fluctuance. Plan for labs & CT pelvis.   CBC: Leukocytosis @ 13.1 w/ left shift. No anemia.  BMP: Mild hypokalemia/hypocalcemia.  CT pelvis: Perirectal abscess present. A small curvilinear density is noted adjacent to this fluid collection superiorly which was not present on the prior exam. This may be related to prior procedure.   Will discuss w/ general surgery.   13:00: CONSULT: Discussed w/ Earnstine Regal PA-C w/ general surgery, will come to ER to evaluate patient.   13:40: CONSULT: Re-discussed w/ Theodis Sato- requests covid 19 swab, plan to go to the OR, in agreement w/ abx in the interim.   Cipro/flagyl ordered. Covid swab ordered.   Patient updated on results & plan of care, provided opportunity for questions, confirmed understanding & is agreeable.   Findings and plan of care discussed with supervising physician Dr. Alvino Chapel who is in agreement.   Finnbar Cedillos was evaluated in Emergency Department on 07/27/2018 for the symptoms described in the history of present illness. He/she was evaluated in the context of the global COVID-19 pandemic, which necessitated consideration that the patient might be at risk for infection with the SARS-CoV-2 virus that causes COVID-19. Institutional protocols and algorithms that pertain to the evaluation of patients at risk for COVID-19 are in a state of rapid change based on information released by regulatory bodies including the CDC and federal and state organizations. These policies and algorithms were followed during the patient's care in the ED.   Final Clinical Impressions(s) / ED Diagnoses   Final diagnoses:  Perirectal abscess    ED Discharge Orders    None       Amaryllis Dyke, PA-C 07/27/18 1715    Davonna Belling, MD 07/28/18 1510

## 2018-07-27 NOTE — Transfer of Care (Signed)
Immediate Anesthesia Transfer of Care Note  Patient: Phillip [name]  Procedure(s) Performed: EXAM UNDER ANESTHESIA; INCISION AND DRAINAGE OF PERIANAL ABSCESS (N/A Anus)  Patient Location: PACU  Anesthesia Type:General  Level of Consciousness: drowsy and responds to stimulation  Airway & Oxygen Therapy: Patient Spontanous Breathing and Patient connected to face mask oxygen  Post-op Assessment: Report given to RN and Post -op Vital signs reviewed and stable  Post vital signs: Reviewed and stable  Last Vitals:  Vitals Value Taken Time  BP 121/77 07/27/2018  6:12 PM  Temp    Pulse 70 07/27/2018  6:11 PM  Resp 13 07/27/2018  6:13 PM  SpO2 100 % 07/27/2018  6:11 PM  Vitals shown include unvalidated device data.  Last Pain:  Vitals:   07/27/18 1700  TempSrc: Oral  PainSc: 10-Worst pain ever      Patients Stated Pain Goal: 5 (07/27/18 1700)  Complications: No apparent anesthesia complications

## 2018-07-27 NOTE — H&P (Signed)
[facility] Admission Note  Phillip [name] 06/13/1980  [id number].    Requesting MD: Davonna Belling Chief Complaint: Rectal pain Reason for Consult: Perirectal abscess   HPI: 38 year old male who presented to the ED with a week of perirectal pain.  He has had 2 prior episodes of perirectal abscess.  He underwent I&D on 06/14/2015 by Dr. Lucia Gaskins and 01/27/2016 by Dr. Johnathan Hausen.  This is the first time he has had issues since 01/2016. He tried Sitz baths and hemorrhoid creams without any improvement.  Currently complains of pain on the right side.  Work-up emergency department shows he is afebrile vital signs are stable.  BMP is unremarkable, potassium 3.4 WBC 13,000 hemoglobin 13.4, hematocrit 40.1, platelets 211,000 CT of the pelvis with contrast shows a 2.8 cm peripherally enhancing fluid collection adjacent to the posterior lateral aspect of the rectum.  There is also other: A curvilinear density adjacent to this fluid collection was not present on prior exams.  Prior to Admission medications   Medication Sig Start Date End Date Taking? Authorizing Provider  chlorhexidine gluconate, MEDLINE KIT, (PERIDEX) 0.12 % solution Use as directed 15 mLs in the mouth or throat 2 (two) times daily. Patient taking differently: Use as directed 15 mLs in the mouth or throat 2 (two) times daily as needed (For antiseptic use.).  09/08/17  Yes McDonald, Mia A, PA-C  ibuprofen (ADVIL,MOTRIN) 200 MG tablet Take 800 mg by mouth every 6 (six) hours as needed for headache, mild pain or moderate pain.    Yes [provider]  lidocaine (XYLOCAINE) 2 % solution Use as directed 15 mLs in the mouth or throat every 3 (three) hours as needed for mouth pain. 09/08/17  Yes McDonald, Mia A, PA-C    ROS: Review of Systems  All other systems reviewed and are negative.   Family History  Problem Relation Age of Onset  . Heart disease Mother        CHF  . Heart failure Mother     Past Medical  History:  Diagnosis Date  . Coronary vasospasm ([place])    a. NSTEMI 10/15 - LHC: no sig CAD;  b. Echo 10/15: EF 50-55%, inf HK  . History of acute inferior wall MI    a. LHC 12/08 - oRCA spasm; apical LAD occluded  . Hyperlipidemia   . Perianal abscess     Past Surgical History:  Procedure Laterality Date  . INCISION AND DRAINAGE PERIRECTAL ABSCESS N/A 06/13/2015   Procedure: IRRIGATION AND DEBRIDEMENT PERIRECTAL ABSCESS;  Surgeon: Alphonsa Overall, MD;  Location: [facility] ORS;  Service: General;  Laterality: N/A;  . INCISION AND DRAINAGE PERIRECTAL ABSCESS N/A 01/27/2016   Procedure: IRRIGATION AND DEBRIDEMENT PERIRECTAL ABSCESS;  Surgeon: Johnathan Hausen, MD;  Location: [facility] ORS;  Service: General;  Laterality: N/A;  . LEFT HEART CATHETERIZATION WITH CORONARY ANGIOGRAM N/A 11/30/2013   Procedure: LEFT HEART CATHETERIZATION WITH CORONARY ANGIOGRAM;  Surgeon: Burnell Blanks, MD;  Location: [facility] CATH LAB;  Service: Cardiovascular;  Laterality: N/A;  . No previous surgeries      Social History:  reports that he has been smoking cigars. He has been smoking about 3.00 packs per day. He has never used smokeless tobacco. He reports that he does not drink alcohol or use drugs.  Allergies: No Known Allergies  (Not in a hospital admission)   Blood pressure 115/83, pulse 60, temperature 98.5 F (36.9 C), temperature source Oral, resp. rate 16, height _0  (1.676 m), weight 59 kg, SpO2  100 %. Physical Exam: Physical Exam Constitutional:      General: He is not in acute distress.    Appearance: Normal appearance. He is normal weight. He is not ill-appearing, toxic-appearing or diaphoretic.  HENT:     Head: Normocephalic.     Nose: Nose normal. No congestion or rhinorrhea.  Eyes:     General: No scleral icterus.    Comments: Pupils are equal  Neck:     Musculoskeletal: Normal range of motion. No neck rigidity or muscular tenderness.     Vascular: No carotid bruit.  Cardiovascular:     Rate and  Rhythm: Normal rate and regular rhythm.     Heart sounds: No murmur.  Pulmonary:     Effort: Pulmonary effort is normal.     Breath sounds: Normal breath sounds.  Abdominal:     General: Abdomen is flat. Bowel sounds are normal.     Palpations: Abdomen is soft.  Genitourinary:    Comments: He is tender right side just inside of the rectum, but I am not feeling a fluctuant area, nor anything solid to account for CT finding.   Musculoskeletal: Normal range of motion.  Lymphadenopathy:     Cervical: No cervical adenopathy.  Skin:    General: Skin is warm and dry.     Capillary Refill: Capillary refill takes less than 2 seconds.  Neurological:     General: No focal deficit present.     Mental Status: He is alert.  Psychiatric:        Mood and Affect: Mood normal.        Behavior: Behavior normal.        Thought Content: Thought content normal.        Judgment: Judgment normal.     Results for orders placed or performed during the hospital encounter of 07/27/18 (from the past 48 hour(s))  CBC with Differential     Status: Abnormal   Collection Time: 07/27/18 10:44 AM  Result Value Ref Range   WBC 13.1 (H) 4.0 - 10.5 K/uL   RBC 4.12 (L) 4.22 - 5.81 MIL/uL   Hemoglobin 13.4 13.0 - 17.0 g/dL   HCT 40.1 39.0 - 52.0 %   MCV 97.3 80.0 - 100.0 fL   MCH 32.5 26.0 - 34.0 pg   MCHC 33.4 30.0 - 36.0 g/dL   RDW 14.0 11.5 - 15.5 %   Platelets 211 150 - 400 K/uL   nRBC 0.0 0.0 - 0.2 %   Neutrophils Relative % 67 %   Neutro Abs 9.0 (H) 1.7 - 7.7 K/uL   Lymphocytes Relative 18 %   Lymphs Abs 2.4 0.7 - 4.0 K/uL   Monocytes Relative 10 %   Monocytes Absolute 1.2 (H) 0.1 - 1.0 K/uL   Eosinophils Relative 3 %   Eosinophils Absolute 0.3 0.0 - 0.5 K/uL   Basophils Relative 1 %   Basophils Absolute 0.1 0.0 - 0.1 K/uL   Immature Granulocytes 1 %   Abs Immature Granulocytes 0.06 0.00 - 0.07 K/uL    Comment: Performed at [facility], [place] [address] [place], [place]  [address]  Basic metabolic panel     Status: Abnormal   Collection Time: 07/27/18 10:44 AM  Result Value Ref Range   Sodium 138 135 - 145 mmol/L   Potassium 3.4 (L) 3.5 - 5.1 mmol/L   Chloride 108 98 - 111 mmol/L   CO2 25 22 - 32 mmol/L   Glucose, Bld 85 70 -  99 mg/dL   BUN 12 6 - 20 mg/dL   Creatinine, Ser 0.96 0.61 - 1.24 mg/dL   Calcium 8.2 (L) 8.9 - 10.3 mg/dL   GFR calc non Af Amer >60 >60 mL/min   GFR calc Af Amer >60 >60 mL/min   Anion gap 5 5 - 15    Comment: Performed at [facility], [place] [address] [place], [place] [address]   Ct Pelvis W Contrast  Result Date: 07/27/2018 CLINICAL DATA:  Perianal pain for 1 week EXAM: CT PELVIS WITH CONTRAST TECHNIQUE: Multidetector CT imaging of the pelvis was performed using the standard protocol following the bolus administration of intravenous contrast. CONTRAST:  168m OMNIPAQUE IOHEXOL 300 MG/ML  SOLN COMPARISON:  01/26/2016 FINDINGS: Urinary Tract:  Bladder is decompressed. Bowel: Visualized small bowel and proximal colon are within normal limits. Adjacent to the posterolateral aspect of the rectum on the right there is a 2.8 cm peripherally enhancing fluid attenuation lesion consistent with a perirectal abscess. Just superior to this there is a focus of curvilinear increased attenuation identified which was not seen on prior examinations. Vascular/Lymphatic: Atherosclerotic calcifications are noted without aneurysmal dilatation. No significant lymphadenopathy is noted. Reproductive:  Prostate is within normal limits. Other:  No other focal fluid collection is noted. Musculoskeletal: Bony structures appear within normal limits. IMPRESSION: Peripherally enhancing fluid collection along the lateral and posterior aspect of the rectum on the right consistent with perirectal abscess. A small curvilinear density is noted adjacent to this fluid collection superiorly which was not present on the prior exam. This may be related to prior  procedure. Clinical correlation is recommended. Electronically Signed   By: MInez CatalinaM.D.   On: 07/27/2018 12:13      Assessment/Plan Hx of NSTEMI with spasm 2015 Tobacco use  recurrent perirectal abscess right side  FEN:  NPO ID: Cipro/flagyl DVT:  SCD Plan: Exam under anesthesia and I&d of abscess    JEarnstine RegalPNewport Beach Surgery Center L PSurgery 07/27/2018, 1:09 PM Pager: [phone]

## 2018-07-27 NOTE — Anesthesia Postprocedure Evaluation (Signed)
Anesthesia Post Note  Patient: Phillip [name]  Procedure(s) Performed: EXAM UNDER ANESTHESIA; INCISION AND DRAINAGE OF PERIANAL ABSCESS (N/A Anus)     Patient location during evaluation: PACU Anesthesia Type: General Level of consciousness: awake and alert Pain management: pain level controlled Vital Signs Assessment: post-procedure vital signs reviewed and stable Respiratory status: spontaneous breathing, nonlabored ventilation, respiratory function stable and patient connected to nasal cannula oxygen Cardiovascular status: blood pressure returned to baseline and stable Postop Assessment: no apparent nausea or vomiting Anesthetic complications: no    Last Vitals:  Vitals:   07/27/18 1845 07/27/18 1900  BP: (!) 140/95 110/78  Pulse: (!) 57 (!) 47  Resp: 10   Temp: (!) 36.3 C 36.8 C  SpO2: 100% 100%    Last Pain:  Vitals:   07/27/18 1845  TempSrc:   PainSc: 0-No pain                 Ryan P Ellender

## 2018-07-27 NOTE — ED Notes (Signed)
Patient transported to CT 

## 2018-07-27 NOTE — Anesthesia Preprocedure Evaluation (Addendum)
Anesthesia Evaluation  Patient identified by MRN, date of birth, ID band Patient awake    Reviewed: Allergy & Precautions, NPO status , Patient's Chart, lab work & pertinent test results  Airway Mallampati: II  TM Distance: >3 FB Neck ROM: Full    Dental  (+) Poor Dentition, Missing,    Pulmonary Current Smoker,    Pulmonary exam normal breath sounds clear to auscultation       Cardiovascular + CAD and + Past MI  Normal cardiovascular exam Rhythm:Regular Rate:Normal     Neuro/Psych negative neurological ROS  negative psych ROS   GI/Hepatic negative GI ROS, Neg liver ROS,   Endo/Other  negative endocrine ROS  Renal/GU negative Renal ROS     Musculoskeletal negative musculoskeletal ROS (+)   Abdominal   Peds  Hematology HLD   Anesthesia Other Findings Peri-rectal abscess  Reproductive/Obstetrics                            Anesthesia Physical Anesthesia Plan  ASA: II and emergent  Anesthesia Plan: General   Post-op Pain Management:    Induction: Intravenous  PONV Risk Score and Plan: 1 and Ondansetron, Dexamethasone, Midazolam and Treatment may vary due to age or medical condition  Airway Management Planned: LMA  Additional Equipment:   Intra-op Plan:   Post-operative Plan: Extubation in OR  Informed Consent: I have reviewed the patients History and Physical, chart, labs and discussed the procedure including the risks, benefits and alternatives for the proposed anesthesia with the patient or authorized representative who has indicated his/her understanding and acceptance.     Dental advisory given  Plan Discussed with: CRNA  Anesthesia Plan Comments:         Anesthesia Quick Evaluation

## 2018-07-28 ENCOUNTER — Encounter (HOSPITAL_COMMUNITY): Payer: Self-pay | Admitting: General Surgery

## 2018-07-28 LAB — CBC
HCT: 42.6 % (ref 39.0–52.0)
Hemoglobin: 13.5 g/dL (ref 13.0–17.0)
MCH: 31.2 pg (ref 26.0–34.0)
MCHC: 31.7 g/dL (ref 30.0–36.0)
MCV: 98.4 fL (ref 80.0–100.0)
Platelets: 220 10*3/uL (ref 150–400)
RBC: 4.33 MIL/uL (ref 4.22–5.81)
RDW: 13.9 % (ref 11.5–15.5)
WBC: 21.9 10*3/uL — ABNORMAL HIGH (ref 4.0–10.5)
nRBC: 0 % (ref 0.0–0.2)

## 2018-07-28 LAB — BASIC METABOLIC PANEL
Anion gap: 8 (ref 5–15)
BUN: 8 mg/dL (ref 6–20)
CO2: 24 mmol/L (ref 22–32)
Calcium: 8.5 mg/dL — ABNORMAL LOW (ref 8.9–10.3)
Chloride: 105 mmol/L (ref 98–111)
Creatinine, Ser: 0.77 mg/dL (ref 0.61–1.24)
GFR calc Af Amer: >60 mL/min (ref >60)
GFR calc non Af Amer: >60 mL/min (ref >60)
Glucose, Bld: 162 mg/dL — ABNORMAL HIGH (ref 70–99)
Potassium: 3.9 mmol/L (ref 3.5–5.1)
Sodium: 137 mmol/L (ref 135–145)

## 2018-07-28 LAB — HIV ANTIBODY (ROUTINE TESTING W REFLEX): HIV Screen 4th Generation wRfx: NONREACTIVE

## 2018-07-28 MED ORDER — AMOXICILLIN-POT CLAVULANATE 875-125 MG PO TABS: 1.0000 | ORAL_TABLET | Freq: Two times a day (BID) | ORAL | 0 refills | Status: DC

## 2018-07-28 MED ORDER — HYDROMORPHONE HCL 1 MG/ML IJ SOLN: 1.0000 mg | INTRAMUSCULAR | Status: DC | PRN

## 2018-07-28 MED ORDER — OXYCODONE HCL 5 MG PO TABS: 5.0000 mg | ORAL_TABLET | Freq: Four times a day (QID) | ORAL | 0 refills | Status: DC | PRN

## 2018-07-28 MED ORDER — AMOXICILLIN-POT CLAVULANATE 875-125 MG PO TABS
1.0000 | ORAL_TABLET | Freq: Two times a day (BID) | ORAL | Status: DC
  Administered 2018-07-28: 1 via ORAL
  Filled 2018-07-28: qty 1

## 2018-07-28 MED ORDER — ENOXAPARIN SODIUM 40 MG/0.4ML ~~LOC~~ SOLN
40.0000 mg | SUBCUTANEOUS | Status: DC
  Administered 2018-07-28: 40 mg via SUBCUTANEOUS
  Filled 2018-07-28: qty 0.4

## 2018-07-28 MED ORDER — OXYCODONE HCL 5 MG PO TABS
5.0000 mg | ORAL_TABLET | ORAL | Status: DC | PRN
  Administered 2018-07-28: 10 mg via ORAL
  Filled 2018-07-28: qty 2
  Filled 2018-07-28: qty 1

## 2018-07-28 NOTE — Progress Notes (Addendum)
[facility] Progress Note  1 Day Post-Op  Subjective: CC-  Resting comfortably. States that he feels much better than prior to surgery. Still requiring some IV dilaudid. Denies abdominal pain, nausea, vomiting.   Objective: Vital signs in last 24 hours: Temp:  [97.2 F (36.2 C)-98.5 F (36.9 C)] 97.5 F (36.4 C) (06/05 0516) Pulse Rate:  [47-91] 57 (06/05 0516) Resp:  [10-20] 15 (06/05 0516) BP: (100-140)/(62-95) 131/73 (06/05 0516) SpO2:  [97 %-100 %] 100 % (06/05 0516) Weight:  [59 kg] 59 kg (06/04 1024) Last BM Date: 07/25/18  Intake/Output from previous day: 06/04 0701 - 06/05 0700 In: 1508 [I.V.:1209.1; IV Piggyback:298.9] Out: 5 [Blood:5] Intake/Output this shift: No intake/output data recorded.  PE: Gen:  Alert, NAD, pleasant HEENT: EOM's intact, pupils equal and round Pulm:  effort normal Abd: Soft, NT/ND, +BS, no HSM Psych: A&Ox3  Skin: warm and dry GU: peri-rectal abscess s/p I&D with packing strips in place >>packing removed, no fluctuance or induration noted, no purulent drainage on dressing  Lab Results:  Recent Labs    07/27/18 1044 07/28/18 0750  WBC 13.1* 21.9*  HGB 13.4 13.5  HCT 40.1 42.6  PLT 211 220   BMET Recent Labs    07/27/18 1044  NA 138  K 3.4*  CL 108  CO2 25  GLUCOSE 85  BUN 12  CREATININE 0.96  CALCIUM 8.2*   PT/INR No results for input(s): LABPROT, INR in the last 72 hours. CMP     Component Value Date/Time   NA 138 07/27/2018 1044   K 3.4 (L) 07/27/2018 1044   CL 108 07/27/2018 1044   CO2 25 07/27/2018 1044   GLUCOSE 85 07/27/2018 1044   BUN 12 07/27/2018 1044   CREATININE 0.96 07/27/2018 1044   CALCIUM 8.2 (L) 07/27/2018 1044   PROT 6.7 06/13/2015 1826   ALBUMIN 3.4 (L) 06/13/2015 1826   AST 15 06/13/2015 1826   ALT 14 (L) 06/13/2015 1826   ALKPHOS 83 06/13/2015 1826   BILITOT 1.1 06/13/2015 1826   GFRNONAA >60 07/27/2018 1044   GFRAA >60 07/27/2018 1044   Lipase  No results found for:  LIPASE     Studies/Results: Ct Pelvis W Contrast  Result Date: 07/27/2018 CLINICAL DATA:  Perianal pain for 1 week EXAM: CT PELVIS WITH CONTRAST TECHNIQUE: Multidetector CT imaging of the pelvis was performed using the standard protocol following the bolus administration of intravenous contrast. CONTRAST:  100mL OMNIPAQUE IOHEXOL 300 MG/ML  SOLN COMPARISON:  01/26/2016 FINDINGS: Urinary Tract:  Bladder is decompressed. Bowel: Visualized small bowel and proximal colon are within normal limits. Adjacent to the posterolateral aspect of the rectum on the right there is a 2.8 cm peripherally enhancing fluid attenuation lesion consistent with a perirectal abscess. Just superior to this there is a focus of curvilinear increased attenuation identified which was not seen on prior examinations. Vascular/Lymphatic: Atherosclerotic calcifications are noted without aneurysmal dilatation. No significant lymphadenopathy is noted. Reproductive:  Prostate is within normal limits. Other:  No other focal fluid collection is noted. Musculoskeletal: Bony structures appear within normal limits. IMPRESSION: Peripherally enhancing fluid collection along the lateral and posterior aspect of the rectum on the right consistent with perirectal abscess. A small curvilinear density is noted adjacent to this fluid collection superiorly which was not present on the prior exam. This may be related to prior procedure. Clinical correlation is recommended. Electronically Signed   By: Alcide CleverMark  Lukens M.D.   On: 07/27/2018 12:13    Anti-infectives: Anti-infectives (  From admission, onward)   Start     Dose/Rate Route Frequency Ordered Stop   07/28/18 1000  amoxicillin-clavulanate (AUGMENTIN) 875-125 MG per tablet 1 tablet     1 tablet Oral Every 12 hours 07/28/18 0834     07/27/18 1745  metroNIDAZOLE (FLAGYL) IVPB 500 mg     500 mg 100 mL/hr over 60 Minutes Intravenous  Once 07/27/18 1739 07/27/18 1741   07/27/18 1738  metroNIDAZOLE  (FLAGYL) 5-0.79 MG/ML-% IVPB    Note to Pharmacy:  Candee Furbish   : cabinet override      07/27/18 1738 07/27/18 1741   07/27/18 1400  ciprofloxacin (CIPRO) IVPB 400 mg     400 mg 200 mL/hr over 60 Minutes Intravenous  Once 07/27/18 1347 07/27/18 1655   07/27/18 1400  metroNIDAZOLE (FLAGYL) IVPB 500 mg     500 mg 100 mL/hr over 60 Minutes Intravenous  Once 07/27/18 1347 07/27/18 1527       Assessment/Plan Tobacco abuse CAD with hx of MI/Coronary spasm  Peri-rectal abscess s/p I&D 6/4 Dr. Derrell Lolling - POD 1 - gram stain with gram positive cocci, negative rods, positive rods; culture pending - packing removed, start sitz baths - needs 7 days of abx  ID - cipro/flagyl 6/4, augmentin 6/5>> FEN - reg diet VTE - SCDs, lovenox Foley - none Follow up - [facility]  Plan - Packing removed. Shower. Working on pain control with oral meds. Possible discharge later today.   LOS: 0 days    Franne Forts , [facility] 07/28/2018, 8:35 AM Pager: [phone] Mon-Thurs 7:00 am-4:30 pm Fri 7:00 am -11:30 AM Sat-Sun 7:00 am-11:30 am

## 2018-07-28 NOTE — Discharge Summary (Signed)
[facility] Discharge Summary   Patient ID: Phillip [name] MRN: [id number] DOB/AGE: Aug 07, 1980 38 y.o.  Admit date: 07/27/2018 Discharge date: 07/28/2018  Admitting Diagnosis: Perirectal abscess  Discharge Diagnosis Patient Active Problem List   Diagnosis Date Noted  . Perirectal abscess 06/13/2015  . NSTEMI (non-ST elevated myocardial infarction) ([place]) 11/30/2013  . Elevated troponin     Consultants None  Imaging: Ct Pelvis W Contrast  Result Date: 07/27/2018 CLINICAL DATA:  Perianal pain for 1 week EXAM: CT PELVIS WITH CONTRAST TECHNIQUE: Multidetector CT imaging of the pelvis was performed using the standard protocol following the bolus administration of intravenous contrast. CONTRAST:  132m OMNIPAQUE IOHEXOL 300 MG/ML  SOLN COMPARISON:  01/26/2016 FINDINGS: Urinary Tract:  Bladder is decompressed. Bowel: Visualized small bowel and proximal colon are within normal limits. Adjacent to the posterolateral aspect of the rectum on the right there is a 2.8 cm peripherally enhancing fluid attenuation lesion consistent with a perirectal abscess. Just superior to this there is a focus of curvilinear increased attenuation identified which was not seen on prior examinations. Vascular/Lymphatic: Atherosclerotic calcifications are noted without aneurysmal dilatation. No significant lymphadenopathy is noted. Reproductive:  Prostate is within normal limits. Other:  No other focal fluid collection is noted. Musculoskeletal: Bony structures appear within normal limits. IMPRESSION: Peripherally enhancing fluid collection along the lateral and posterior aspect of the rectum on the right consistent with perirectal abscess. A small curvilinear density is noted adjacent to this fluid collection superiorly which was not present on the prior exam. This may be related to prior procedure. Clinical correlation is recommended. Electronically Signed   By: MInez CatalinaM.D.   On: 07/27/2018 12:13     Procedures Dr. RRosendo Gros(07/27/18) - [facility]  [facility]ill[facility] Lunais [facility] 89[facility].  He has had 2 prior episodes of perirectal abscess.  He underwent I&D on 06/14/2015 by Dr. NLucia Gaskinsand 01/27/2016 by Dr. MJohnathan Hausen  This is the first time he has had issues since 01/2016. Workup showed 2.8 cm peripherally enhancing fluid collection adjacent to the posterior lateral aspect of the rectum.  Patient was admitted and underwent procedure listed above.  Tolerated procedure well and was transferred to the floor. Packing removed on POD#1 and patient started on sitz baths. On 6/5 the patient was voiding well, tolerating diet, ambulating well, pain well controlled, vital signs stable and felt stable for discharge home.  Patient was sent home on 7 days of augmentin. Patient will follow up as below and knows to call with questions or concerns.     Allergies as of 07/28/2018   No Known Allergies     Medication List    TAKE these medications   amoxicillin-clavulanate 875-125 MG tablet Commonly known as:  AUGMENTIN Take 1 tablet by mouth every 12 (twelve) hours.   chlorhexidine gluconate (MEDLINE KIT) 0.12 % solution Commonly known as:  PERIDEX Use as directed 15 mLs in the mouth or throat 2 (two) times daily. What changed:    when to take this  reasons to take this   ibuprofen 200 MG tablet Commonly known as:  ADVIL Take 800 mg by mouth every 6 (six) hours as needed for headache, mild pain or moderate pain.   lidocaine 2 % solution Commonly known as:  XYLOCAINE Use as directed 15 mLs in the mouth or throat every 3 (three) hours as needed for mouth pain.  oxyCODONE 5 MG immediate release tablet Commonly known as:  Oxy IR/ROXICODONE Take 1 tablet (5 mg total) by mouth every 6 (six) hours as needed for severe pain.        Follow-up Information    [facility], [place]. Go on  08/10/2018.   Specialty:  General Surgery Why:  Your appointment is 6/18 3pm. Be at the office 30 minutes early for check in.  Bring photo ID and insurance information. Contact information: [address] [place] [address] [phone]           Signed: Wellington Hampshire, [facility] 07/28/2018, 11:16 AM Pager: [phone] Mon-Thurs 7:00 am-4:30 pm Fri 7:00 am -11:30 AM Sat-Sun 7:00 am-11:30 am

## 2018-07-28 NOTE — Discharge Instructions (Signed)
ANORECTAL SURGERY:  POST OPERATIVE INSTRUCTIONS  ######################################################################  EAT Start with full liquid diet After 24 hours, gradually transition to a high fiber diet.    CONTROL PAIN Control pain so you can tolerate bowel movements,  walk, sleep, tolerate sneezing/coughing, and go up/down stairs.   HAVE A BOWEL MOVEMENT DAILY Keep your bowels regular to avoid problems.   Taking a fiber supplement every day to keep bowels soft.   Try a laxative to override constipation. Use an antidairrheal to slow down diarrhea.   Call if not better after 2 tries  WALK Walk an hour a day.  Control your pain to do that.   CALL IF YOU HAVE PROBLEMS/CONCERNS Call if you are still struggling despite following these instructions. Call if you have concerns not answered by these instructions  ######################################################################    1. Take your usually prescribed home medications unless otherwise directed. 2. DIET: Follow a light bland diet the first 24 hours after arrival home, such as soup, liquids, crackers, etc.  Be sure to include lots of fluids daily.  Avoid fast food or heavy meals as your are more likely to get nauseated.  Eat a low fat the next few days after surgery.   3. PAIN CONTROL: a. Pain is best controlled by a usual combination of three different methods TOGETHER: i. Ice/Heat ii. Over the counter pain medication iii. Prescription pain medication b. Expect swelling and discomfort in the anus/rectal area.  Warm water baths (30-60 minutes up to 6 times a day, especially after bowel meovements) will help. Use ice for the first few days to help decrease swelling and bruising, then switch to heat such as warm towels, sitz baths, warm baths, etc to help relax tight/sore spots and speed recovery.  Some people prefer to use ice alone, heat alone, alternating between ice & heat.  Experiment to what works for you.     c. It is helpful to take an over-the-counter pain medication continuously for the first few weeks.  Choose one of the following that works best for you: i. Naproxen (Aleve, etc)  Two 220mg  tabs twice a day ii. Ibuprofen (Advil, etc) Three 200mg  tabs four times a day (every meal & bedtime) iii. Acetaminophen (Tylenol, etc) 500-650mg  four times a day (every meal & bedtime) d. A  prescription for pain medication (such as oxycodone, hydrocodone, etc) should be given to you upon discharge.  Take your pain medication as prescribed.  i. If you are having problems/concerns with the prescription medicine (does not control pain, nausea, vomiting, rash, itching, etc), please call us [phone] to see if we need to switch you to a different pain medicine that will work better for you and/or control your side effect better. ii. If you need a refill on your pain medication, please contact your pharmacy.  They will contact our office to request authorization. Prescriptions will not be filled after 5 pm or on week-ends.  If can take up to 48 hours for it to be filled & ready so avoid waiting until you are down to thel ast pill. e. A topical cream (Dibucaine) or a prescription for a cream (such as diltiazem 2% gel) may be given to you.  Many people find relief with topical creams.  Some people find it burns too much.  Experiment.  If it helps, use it.  If it burns, don't using it.  Use a Sitz Bath 4-8 times a day for relief   [employer] A sitz bath is a  warm water bath taken in the sitting position that covers only the hips and buttocks. It may be used for either healing or hygiene purposes. Sitz baths are also used to relieve pain, itching, or muscle spasms. The water may contain medicine. Moist heat will help you heal and relax.  HOME CARE INSTRUCTIONS  Take 3 to 4 sitz baths a day. 1. Fill the bathtub half full with warm water. 2. Sit in the water and open the drain a little. 3. Turn on the warm water to  keep the tub half full. Keep the water running constantly. 4. Soak in the water for 15 to 20 minutes. 5. After the sitz bath, pat the affected area dry first.   4. KEEP YOUR BOWELS REGULAR a. The goal is one soft bowel movement a day b. Avoid getting constipated.  Between the surgery and the pain medications, it is common to experience some constipation.  Increasing fluid intake and taking a fiber supplement (such as Metamucil, Citrucel, FiberCon, MiraLax, etc) 2-3 times a day regularly will usually help prevent this problem from occurring.  A mild laxative (prune juice, Milk of Magnesia, MiraLax, etc) should be taken according to package directions if there are no bowel movements after 48 hours. c. Watch out for diarrhea.  If you have many loose bowel movements, simplify your diet to bland foods & liquids for a few days.  Stop any stool softeners and decrease your fiber supplement.  Switching to mild anti-diarrheal medications (Kayopectate, Pepto Bismol) can help.  Can try an imodium/loperamide dose.  If this worsens or does not improve, please call us.  5. Wound Care  a. Remove your bandages with your first bowel movement, usually the day after surgery.  You may have packing if you had an abscess.  Let any packing or gauze fall come out.   b. Wear an absorbent pad or soft cotton balls in your underwear as needed to catch any drainage and help keep the area  c. Keep the area clean and dry.  Bathe / shower every day.  Keep the area clean by showering / bathing over the incision / wound.   It is okay to soak an open wound to help wash it.  Consider using a squeeze bottle filled with warm water to gently wash the anal area.  Wet wipes or showers / gentle washing after bowel movements is often less traumatic than regular toilet paper. d. Bonita QuinYou will often notice bleeding with bowel movements.  This should slow down by the end of the first week of surgery.  Sitting on an ice pack can help. e. Expect some  drainage.  This should slow down by the end of the first week of surgery, but you will have occasional bleeding or drainage up to a few months after surgery.  Wear an absorbent pad or soft cotton gauze in your underwear until the drainage stops.  6. ACTIVITIES as tolerated:   a. You may resume regular (light) daily activities beginning the next day--such as daily self-care, walking, climbing stairs--gradually increasing activities as tolerated.  If you can walk 30 minutes without difficulty, it is safe to try more intense activity such as jogging, treadmill, bicycling, low-impact aerobics, swimming, etc. b. Save the most intensive and strenuous activity for last such as sit-ups, heavy lifting, contact sports, etc  Refrain from any heavy lifting or straining until you are off narcotics for pain control.   c. DO NOT PUSH THROUGH PAIN.  Let pain be your  guide: If it hurts to do something, don't do it.  Pain is your body warning you to avoid that activity for another week until the pain goes down. d. You may drive when you are no longer taking prescription pain medication, you can comfortably sit for long periods of time, and you can safely maneuver your car and apply brakes. e. Bonita Quin may have sexual intercourse when it is comfortable.  7. FOLLOW UP in our office a. Please call [facility] at [phone] to set up an appointment to see your surgeon in the office for a follow-up appointment approximately 2-3 weeks after your surgery. b. Make sure that you call for this appointment the day you arrive home to ensure a convenient appointment time.  8. IF YOU HAVE DISABILITY OR FAMILY LEAVE FORMS, BRING THEM TO THE OFFICE FOR PROCESSING.  DO NOT GIVE THEM TO YOUR DOCTOR.        WHEN TO CALL us [phone]: 1. Poor pain control 2. Reactions / problems with new medications (rash/itching, nausea, etc)  3. Fever over 101.5 F (38.5 C) 4. Inability to urinate 5. Nausea and/or vomiting 6. Worsening swelling or  bruising 7. Continued bleeding from incision. 8. Increased pain, redness, or drainage from the incision  The clinic staff is available to answer your questions during regular business hours (8:30am-5pm).  Please dont hesitate to call and ask to speak to one of our nurses for clinical concerns.   A surgeon from [facility] is always on call at the hospitals   If you have a medical emergency, go to the nearest emergency room or call 911.    [facility] [address], [place], [place]  [address] ? MAIN: [phone] ? TOLL FREE: [phone] ? FAX [phone] [email or web address]    Smoking Tobacco Information, Adult Smoking tobacco can be harmful to your health. Tobacco contains a poisonous (toxic), colorless chemical called nicotine. Nicotine is addictive. It changes the brain and can make it hard to stop smoking. Tobacco also has other toxic chemicals that can hurt your body and raise your risk of many cancers. How can smoking tobacco affect me? Smoking tobacco puts you at risk for:  Cancer. Smoking is most commonly associated with lung cancer, but can also lead to cancer in other parts of the body.  Chronic obstructive pulmonary disease (COPD). This is a long-term lung condition that makes it hard to breathe. It also gets worse over time.  High blood pressure (hypertension), heart disease, stroke, or heart attack.  Lung infections, such as pneumonia.  Cataracts. This is when the lenses in the eyes become clouded.  Digestive problems. This may include peptic ulcers, heartburn, and gastroesophageal reflux disease (GERD).  Oral health problems, such as gum disease and tooth loss.  Loss of taste and smell. Smoking can affect your appearance by causing:  Wrinkles.  Yellow or stained teeth, fingers, and fingernails. Smoking tobacco can also affect your social life, because:  It may be challenging to find places to  smoke when away from home. Many workplaces, [employer], hotels, and public places are tobacco-free.  Smoking is expensive. This is due to the cost of tobacco and the long-term costs of treating health problems from smoking.  Secondhand smoke may affect those around you. Secondhand smoke can cause lung cancer, breathing problems, and heart disease. Children of smokers have a higher risk for: ? Sudden infant death syndrome (SIDS). ? Ear infections. ? Lung infections. If you currently  smoke tobacco, quitting now can help you:  Lead a longer and healthier life.  Look, smell, breathe, and feel better over time.  Save money.  Protect others from the harms of secondhand smoke. What actions can I take to prevent health problems? Quit smoking   Do not start smoking. Quit if you already do.  Make a plan to quit smoking and commit to it. Look for programs to help you and ask your health care provider for recommendations and ideas.  Set a date and write down all the reasons you want to quit.  Let your friends and family know you are quitting so they can help and support you. Consider finding friends who also want to quit. It can be easier to quit with someone else, so that you can support each other.  Talk with your health care provider about using nicotine replacement medicines to help you quit, such as gum, lozenges, patches, sprays, or pills.  Do not replace cigarette smoking with electronic cigarettes, which are commonly called e-cigarettes. The safety of e-cigarettes is not known, and some may contain harmful chemicals.  If you try to quit but return to smoking, stay positive. It is common to slip up when you first quit, so take it one day at a time.  Be prepared for cravings. When you feel the urge to smoke, chew gum or suck on hard candy. Lifestyle  Stay busy and take care of your body.  Drink enough fluid to keep your urine pale yellow.  Get plenty of exercise and eat a healthy  diet. This can help prevent weight gain after quitting.  Monitor your eating habits. Quitting smoking can cause you to have a larger appetite than when you smoke.  Find ways to relax. Go out with friends or family to a movie or a restaurant where people do not smoke.  Ask your health care provider about having regular tests (screenings) to check for cancer. This may include blood tests, imaging tests, and other tests.  Find ways to manage your stress, such as meditation, yoga, or exercise. Where to find support To get support to quit smoking, consider:  Asking your health care provider for more information and resources.  Taking classes to learn more about quitting smoking.  Looking for local organizations that offer resources about quitting smoking.  Joining a support group for people who want to quit smoking in your local community.  Calling the smokefree.gov counselor helpline: 1-800-Quit-Now 505-736-4714) Where to find more information You may find more information about quitting smoking from:  HelpGuide.org: www.helpguide.org  [employer]: smokefree.gov  American Lung Association: www.lung.org Contact a health care provider if you:  Have problems breathing.  Notice that your lips, nose, or fingers turn blue.  Have chest pain.  Are coughing up blood.  Feel faint or you pass out.  Have other health changes that cause you to worry. Summary  Smoking tobacco can negatively affect your health, the health of those around you, your finances, and your social life.  Do not start smoking. Quit if you already do. If you need help quitting, ask your health care provider.  Think about joining a support group for people who want to quit smoking in your local community. There are many effective programs that will help you to quit this behavior. This information is not intended to replace advice given to you by your health care provider. Make sure you discuss any questions  you have with your health care provider. Document Released: 02/24/2016  Document Revised: 03/30/2017 Document Reviewed: 02/24/2016 Elsevier Interactive Patient Education  [employer].

## 2018-07-28 NOTE — Progress Notes (Signed)
Pt dc home via GPT. He verbalized understanding og meds and dc instructions.

## 2018-07-29 ENCOUNTER — Other Ambulatory Visit: Payer: Self-pay | Admitting: General Surgery

## 2018-07-29 MED ORDER — LIDOCAINE 5 % EX OINT: 1.0000 "application " | TOPICAL_OINTMENT | CUTANEOUS | 0 refills | Status: DC | PRN

## 2018-07-30 LAB — AEROBIC/ANAEROBIC CULTURE W GRAM STAIN (SURGICAL/DEEP WOUND)

## 2019-03-21 ENCOUNTER — Emergency Department (HOSPITAL_COMMUNITY)
Admission: EM | Admit: 2019-03-21 | Discharge: 2019-03-21 | Disposition: A | Discharge status: Home / Self Care | Payer: Self-pay | Attending: Emergency Medicine | Admitting: Emergency Medicine

## 2019-03-21 ENCOUNTER — Other Ambulatory Visit: Payer: Self-pay

## 2019-03-21 ENCOUNTER — Emergency Department (HOSPITAL_COMMUNITY): Payer: Self-pay

## 2019-03-21 ENCOUNTER — Encounter (HOSPITAL_COMMUNITY): Payer: Self-pay | Admitting: Emergency Medicine

## 2019-03-21 DIAGNOSIS — Y929 Unspecified place or not applicable: Secondary | ICD-10-CM | POA: Insufficient documentation

## 2019-03-21 DIAGNOSIS — S46912A Strain of unspecified muscle, fascia and tendon at shoulder and upper arm level, left arm, initial encounter: Secondary | ICD-10-CM | POA: Insufficient documentation

## 2019-03-21 DIAGNOSIS — Y999 Unspecified external cause status: Secondary | ICD-10-CM | POA: Insufficient documentation

## 2019-03-21 DIAGNOSIS — Y9389 Activity, other specified: Secondary | ICD-10-CM | POA: Insufficient documentation

## 2019-03-21 DIAGNOSIS — F17290 Nicotine dependence, other tobacco product, uncomplicated: Secondary | ICD-10-CM | POA: Insufficient documentation

## 2019-03-21 MED ORDER — IBUPROFEN 200 MG PO TABS
600.0000 mg | ORAL_TABLET | Freq: Once | ORAL | Status: AC
  Administered 2019-03-21: 600 mg via ORAL
  Filled 2019-03-21: qty 3
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
## 2019-03-21 NOTE — Discharge Instructions (Signed)
Use ibuprofen and/or Tylenol as well as ice for your left shoulder.
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
## 2020-05-29 ENCOUNTER — Emergency Department (HOSPITAL_COMMUNITY)
Admission: EM | Admit: 2020-05-29 | Discharge: 2020-05-29 | Disposition: A | Discharge status: Home / Self Care | Payer: Self-pay | Attending: Emergency Medicine | Admitting: Emergency Medicine

## 2020-05-29 ENCOUNTER — Other Ambulatory Visit: Payer: Self-pay

## 2020-05-29 ENCOUNTER — Encounter (HOSPITAL_COMMUNITY): Payer: Self-pay | Admitting: Emergency Medicine

## 2020-05-29 DIAGNOSIS — Z955 Presence of coronary angioplasty implant and graft: Secondary | ICD-10-CM | POA: Insufficient documentation

## 2020-05-29 DIAGNOSIS — K029 Dental caries, unspecified: Secondary | ICD-10-CM | POA: Insufficient documentation

## 2020-05-29 DIAGNOSIS — K0889 Other specified disorders of teeth and supporting structures: Secondary | ICD-10-CM

## 2020-05-29 DIAGNOSIS — F17290 Nicotine dependence, other tobacco product, uncomplicated: Secondary | ICD-10-CM | POA: Insufficient documentation

## 2020-05-29 MED ORDER — PENICILLIN V POTASSIUM 500 MG PO TABS: 500.0000 mg | ORAL_TABLET | Freq: Four times a day (QID) | ORAL | 0 refills | Status: AC

## 2020-05-29 NOTE — Discharge Instructions (Addendum)
I recommend a combination of tylenol and ibuprofen for management of your pain. You can take a low dose of both at the same time. I recommend 500 mg of Tylenol combined with 600 mg of ibuprofen. This is one maximum strength Tylenol and three regular ibuprofen. You can take these 2-3 times for day for your pain. Please try to take these medications with a small amount of food as well to prevent upsetting your stomach.  Also, consider salt water gargles and rinses in the mouth 2-3 times per day.  I would also recommend applying clove oil and/or Orajel to the region that is causing pain.  I am going to prescribe you a course of penicillin.  You can take this 4 times a day for the next week.  This will help reduce the infection in your mouth.  Do not stop taking this medication early.  Please make sure you take the full 7-day course.  Please follow-up with your dentist at your scheduled appointment in 1 week.  If you develop worsening symptoms, please return to the emergency department.  It was a pleasure to meet you.

## 2020-05-29 NOTE — ED Triage Notes (Signed)
Patient presents with an abscess located on the right side of his upper gum. He complains of pain in that same area. Ibuprofen has not helped the pain.

## 2020-05-29 NOTE — ED Provider Notes (Signed)
[facility]-EMERGENCY DEPT Provider Note   CSN: [id number] Arrival date & time: 05/29/20  1110     History Chief Complaint  Patient presents with  . Dental Pain    Sender Phillip [name] is a 40 y.o. male.  HPI Patient is a 40 year old male with a medical history as noted below.  He presents emergency department due to left upper dental pain that started about 1 week ago.  Symptoms have been worsening.  Reports worsening pain when chewing.  States that he has an appointment with a dentist in 1 week but came to the ED today due to his pain.  He has been taking ibuprofen with mild short-term relief.  Patient is a current smoker.  Denies any neck pain, sore throat, shortness of breath, difficulty swallowing, drooling.    Past Medical History:  Diagnosis Date  . Coronary vasospasm (HCC)    a. NSTEMI 10/15 - LHC: no sig CAD;  b. Echo 10/15: EF 50-55%, inf HK  . History of acute inferior wall MI    a. LHC 12/08 - oRCA spasm; apical LAD occluded  . Hyperlipidemia   . Perianal abscess     Patient Active Problem List   Diagnosis Date Noted  . Perirectal abscess 06/13/2015  . NSTEMI (non-ST elevated myocardial infarction) (HCC) 11/30/2013  . Elevated troponin     Past Surgical History:  Procedure Laterality Date  . INCISION AND DRAINAGE PERIRECTAL ABSCESS N/A 06/13/2015   Procedure: IRRIGATION AND DEBRIDEMENT PERIRECTAL ABSCESS;  Surgeon: Ovidio Kin, MD;  Location: [facility];  Service: General;  Laterality: N/A;  . INCISION AND DRAINAGE PERIRECTAL ABSCESS N/A 01/27/2016   Procedure: IRRIGATION AND DEBRIDEMENT PERIRECTAL ABSCESS;  Surgeon: Luretha Murphy, MD;  Location: [facility];  Service: General;  Laterality: N/A;  . INCISION AND DRAINAGE PERIRECTAL ABSCESS N/A 07/27/2018   Procedure: EXAM UNDER ANESTHESIA; INCISION AND DRAINAGE OF PERIANAL ABSCESS;  Surgeon: Axel Filler, MD;  Location: [facility];  Service: General;  Laterality: N/A;  . LEFT HEART CATHETERIZATION WITH  CORONARY ANGIOGRAM N/A 11/30/2013   Procedure: LEFT HEART CATHETERIZATION WITH CORONARY ANGIOGRAM;  Surgeon: Kathleene Hazel, MD;  Location: [facility] CATH LAB;  Service: Cardiovascular;  Laterality: N/A;  . No previous surgeries         Family History  Problem Relation Age of Onset  . Heart disease Mother        CHF  . Heart failure Mother     Social History   Tobacco Use  . Smoking status: Current Every Day Smoker    Packs/day: 3.00    Types: Cigars  . Smokeless tobacco: Never Used  Substance Use Topics  . Alcohol use: No  . Drug use: No    Home Medications Prior to Admission medications   Medication Sig Start Date End Date Taking? Authorizing Provider  penicillin v potassium (VEETID) 500 MG tablet Take 1 tablet (500 mg total) by mouth 4 (four) times daily for 7 days. 05/29/20 06/05/20 Yes Placido Sou, PA-C    Allergies    Patient has no known allergies.  Review of Systems   Review of Systems  Constitutional: Negative for chills and fever.  HENT: Positive for dental problem. Negative for drooling, sore throat and trouble swallowing.   Respiratory: Negative for shortness of breath.    Physical Exam Updated Vital Signs BP 120/73 (BP Location: Left Arm)   Pulse 67   Temp 98.1 F (36.7 C) (Oral)   Resp 18   Ht 5\' 6"  (1.676  m)   Wt 59 kg   SpO2 97%   BMI 20.98 kg/m   Physical Exam Vitals and nursing note reviewed.  Constitutional:      General: He is not in acute distress.    Appearance: Normal appearance. He is well-developed and normal weight.  HENT:     Head: Normocephalic and atraumatic.     Right Ear: External ear normal.     Left Ear: External ear normal.     Mouth/Throat:     Mouth: Mucous membranes are moist.     Pharynx: Oropharynx is clear. No oropharyngeal exudate or posterior oropharyngeal erythema.     Comments: Poor dentition.  Diffuse dental caries, broken teeth, and missing teeth.  Point of moderate tenderness that appears to be a  developing dental abscess along the left upper second molar.  No significant fluctuance.  Uvula midline.  Readily handling secretions.  Soft submental and sublingual compartments. Eyes:     General: No scleral icterus.       Right eye: No discharge.        Left eye: No discharge.     Conjunctiva/sclera: Conjunctivae normal.  Neck:     Trachea: No tracheal deviation.  Cardiovascular:     Rate and Rhythm: Normal rate.  Pulmonary:     Effort: Pulmonary effort is normal. No respiratory distress.     Breath sounds: No stridor.  Abdominal:     General: There is no distension.  Musculoskeletal:        General: No swelling or deformity.     Cervical back: Neck supple.  Skin:    General: Skin is warm and dry.     Findings: No rash.  Neurological:     Mental Status: He is alert.     Cranial Nerves: Cranial nerve deficit: no gross deficits.    ED Results / Procedures / Treatments   Labs (all labs ordered are listed, but only abnormal results are displayed) Labs Reviewed - No data to display  EKG None  Radiology No results found.  Procedures Procedures   Medications Ordered in ED Medications - No data to display  ED Course  I have reviewed the triage vital signs and the nursing notes.  Pertinent labs & imaging results that were available during my care of the patient were reviewed by me and considered in my medical decision making (see chart for details).    MDM Rules/Calculators/A&P                          Patient is a 40 year old male who presents the emergency department with what appears to be a developing dental abscess.  History of similar symptoms in the past.  He is a smoker.  Exam is overall reassuring.  He does have very poor dentition at baseline.  Exam is nonconcerning for Ludwig's angina or PTA at this time.  Patient has no other complaints besides his pain.  Recommended continued use of Tylenol and ibuprofen for management of his pain.  We discussed  dosing.  Salt water gargles.  Orajel and clove oil.  Will discharge on a course of penicillin VK.  He has an appointment with his dentist in 1 week.  Urged him to make sure that he goes to this appointment.  We discussed return precautions.  His questions were answered and he was amicable to time of discharge.  Final Clinical Impression(s) / ED Diagnoses Final diagnoses:  Pain, dental  Rx / DC Orders ED Discharge Orders         Ordered    penicillin v potassium (VEETID) 500 MG tablet  4 times daily        05/29/20 1152           Placido Sou, PA-C 05/29/20 1159    Cathren Laine, MD 05/29/20 1244
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
## 2023-01-11 ENCOUNTER — Other Ambulatory Visit: Payer: Self-pay

## 2023-01-11 ENCOUNTER — Emergency Department (HOSPITAL_COMMUNITY)
Admission: EM | Admit: 2023-01-11 | Discharge: 2023-01-11 | Disposition: A | Discharge status: Home / Self Care | Payer: No Typology Code available for payment source | Attending: Emergency Medicine | Admitting: Emergency Medicine

## 2023-01-11 ENCOUNTER — Encounter (HOSPITAL_COMMUNITY): Payer: Self-pay | Admitting: Emergency Medicine

## 2023-01-11 DIAGNOSIS — Y92410 Unspecified street and highway as the place of occurrence of the external cause: Secondary | ICD-10-CM | POA: Insufficient documentation

## 2023-01-11 DIAGNOSIS — M546 Pain in thoracic spine: Secondary | ICD-10-CM

## 2023-01-11 DIAGNOSIS — S29012A Strain of muscle and tendon of back wall of thorax, initial encounter: Secondary | ICD-10-CM | POA: Diagnosis not present

## 2023-01-11 DIAGNOSIS — T148XXA Other injury of unspecified body region, initial encounter: Secondary | ICD-10-CM

## 2023-01-11 DIAGNOSIS — M549 Dorsalgia, unspecified: Secondary | ICD-10-CM | POA: Diagnosis present

## 2023-01-11 MED ORDER — METHOCARBAMOL 500 MG PO TABS: 500.0000 mg | ORAL_TABLET | Freq: Two times a day (BID) | ORAL | 0 refills | Status: AC

## 2023-01-11 MED ORDER — KETOROLAC TROMETHAMINE 15 MG/ML IJ SOLN
15.0000 mg | Freq: Once | INTRAMUSCULAR | Status: AC
  Administered 2023-01-11: 15 mg via INTRAMUSCULAR
  Filled 2023-01-11: qty 1
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
## 2023-01-11 NOTE — Discharge Instructions (Addendum)
 It was a pleasure caring for you today.  Please follow-up with your primary care provider.  Seek emergency care if experiencing any new or worsening symptoms.  Alternating between 650 mg Tylenol and 400 mg Advil: The best way to alternate taking Acetaminophen (example Tylenol) and Ibuprofen (example Advil/Motrin) is to take them 3 hours apart. For example, if you take ibuprofen at 6 am you can then take Tylenol at 9 am. You can continue this regimen throughout the day, making sure you do not exceed the recommended maximum dose for each drug.
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
## 2023-01-16 ENCOUNTER — Other Ambulatory Visit: Payer: Self-pay

## 2023-01-16 ENCOUNTER — Emergency Department (HOSPITAL_COMMUNITY): Payer: No Typology Code available for payment source

## 2023-01-16 ENCOUNTER — Emergency Department (HOSPITAL_COMMUNITY)
Admission: EM | Admit: 2023-01-16 | Discharge: 2023-01-16 | Disposition: A | Discharge status: Home / Self Care | Payer: No Typology Code available for payment source | Attending: Emergency Medicine | Admitting: Emergency Medicine

## 2023-01-16 ENCOUNTER — Encounter (HOSPITAL_COMMUNITY): Payer: Self-pay

## 2023-01-16 DIAGNOSIS — S39012A Strain of muscle, fascia and tendon of lower back, initial encounter: Secondary | ICD-10-CM | POA: Insufficient documentation

## 2023-01-16 DIAGNOSIS — R200 Anesthesia of skin: Secondary | ICD-10-CM | POA: Insufficient documentation

## 2023-01-16 DIAGNOSIS — S93402A Sprain of unspecified ligament of left ankle, initial encounter: Secondary | ICD-10-CM | POA: Diagnosis not present

## 2023-01-16 DIAGNOSIS — Y92410 Unspecified street and highway as the place of occurrence of the external cause: Secondary | ICD-10-CM | POA: Insufficient documentation

## 2023-01-16 DIAGNOSIS — M25572 Pain in left ankle and joints of left foot: Secondary | ICD-10-CM | POA: Diagnosis present

## 2023-01-16 MED ORDER — CYCLOBENZAPRINE HCL 10 MG PO TABS
10.0000 mg | ORAL_TABLET | Freq: Once | ORAL | Status: AC
  Administered 2023-01-16: 10 mg via ORAL
  Filled 2023-01-16: qty 1

## 2023-01-16 MED ORDER — LIDOCAINE 5 % EX PTCH
1.0000 | MEDICATED_PATCH | Freq: Once | CUTANEOUS | Status: DC
  Administered 2023-01-16: 2 via TRANSDERMAL
  Filled 2023-01-16: qty 1

## 2023-01-16 MED ORDER — LIDOCAINE 5 % EX PTCH: 1.0000 | MEDICATED_PATCH | CUTANEOUS | 0 refills | Status: AC

## 2023-01-16 MED ORDER — KETOROLAC TROMETHAMINE 30 MG/ML IJ SOLN
30.0000 mg | Freq: Once | INTRAMUSCULAR | Status: AC
  Administered 2023-01-16: 30 mg via INTRAMUSCULAR
  Filled 2023-01-16: qty 1

## 2023-01-16 MED ORDER — CYCLOBENZAPRINE HCL 10 MG PO TABS: 10.0000 mg | ORAL_TABLET | Freq: Two times a day (BID) | ORAL | 0 refills | Status: AC | PRN

## 2023-01-16 MED ORDER — ACETAMINOPHEN 500 MG PO TABS
1000.0000 mg | ORAL_TABLET | Freq: Once | ORAL | Status: AC
  Administered 2023-01-16: 1000 mg via ORAL
  Filled 2023-01-16: qty 2
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
## 2023-01-16 NOTE — ED Notes (Signed)
X-ray at bedside

## 2024-01-25 ENCOUNTER — Emergency Department (HOSPITAL_COMMUNITY)
Admission: EM | Admit: 2024-01-25 | Discharge: 2024-01-25 | Disposition: A | Discharge status: Home / Self Care | Payer: Self-pay | Attending: Emergency Medicine | Admitting: Emergency Medicine

## 2024-01-25 ENCOUNTER — Encounter (HOSPITAL_COMMUNITY): Payer: Self-pay

## 2024-01-25 ENCOUNTER — Other Ambulatory Visit: Payer: Self-pay

## 2024-01-25 DIAGNOSIS — F17200 Nicotine dependence, unspecified, uncomplicated: Secondary | ICD-10-CM | POA: Insufficient documentation

## 2024-01-25 DIAGNOSIS — Z202 Contact with and (suspected) exposure to infections with a predominantly sexual mode of transmission: Secondary | ICD-10-CM | POA: Insufficient documentation

## 2024-01-25 MED ORDER — METRONIDAZOLE 500 MG PO TABS
2000.0000 mg | ORAL_TABLET | Freq: Once | ORAL | Status: AC
  Administered 2024-01-25: 2000 mg via ORAL
  Filled 2024-01-25: qty 4

## 2024-01-25 NOTE — ED Provider Notes (Signed)
 [place] EMERGENCY DEPARTMENT AT [facility] Provider Note   CSN: [id number] Arrival date & time: 01/25/24  1820     Patient presents with: Exposure to STD   Phillip [name] is a 43 y.o. male with past medical history of NSTEMI, coronary vasospasm, perianal abscess presents to Emergency Department for evaluation of exposure to STD.  Reports that his partner tested positive for BV and trichomonas and he has since had intercourse with her.  His only symptom is burning with urination.  No testicular swelling nor pain, penile discharge, nor fever  {Add pertinent medical, surgical, social history, OB history to HPI:32947}  Exposure to STD       Prior to Admission medications   Medication Sig Start Date End Date Taking? Authorizing Provider  cyclobenzaprine  (FLEXERIL ) 10 MG tablet Take 1 tablet (10 mg total) by mouth 2 (two) times daily as needed for muscle spasms. 01/16/23   Kingsley, Victoria K, DO  lidocaine  (LIDODERM ) 5 % Place 1 patch onto the skin daily. Remove & Discard patch within 12 hours or as directed by MD 01/16/23   Kingsley, Victoria K, DO    Allergies: Patient has no known allergies.    Review of Systems  Genitourinary:  Positive for dysuria. Negative for penile discharge, penile pain, penile swelling, scrotal swelling and testicular pain.    Updated Vital Signs BP (!) 130/90   Pulse 82   Temp 98 F (36.7 C) (Oral)   Resp 19   Ht 5' 6 (1.676 m)   Wt 67 kg   SpO2 100%   BMI 23.84 kg/m   Physical Exam Vitals and nursing note reviewed.  Constitutional:      General: He is not in acute distress.    Appearance: Normal appearance.  HENT:     Head: Normocephalic and atraumatic.  Eyes:     Conjunctiva/sclera: Conjunctivae normal.  Cardiovascular:     Rate and Rhythm: Normal rate.  Pulmonary:     Effort: Pulmonary effort is normal. No respiratory distress.     Breath sounds: Normal breath sounds.  Abdominal:     General: Bowel sounds are  normal. There is no distension.     Palpations: Abdomen is soft.     Tenderness: There is no abdominal tenderness. There is no right CVA tenderness, left CVA tenderness, guarding or rebound.  Skin:    Coloration: Skin is not jaundiced or pale.  Neurological:     Mental Status: He is alert. Mental status is at baseline.   Patient refuses GU exam  (all labs ordered are listed, but only abnormal results are displayed) Labs Reviewed  SYPHILIS: RPR W/REFLEX TO RPR TITER AND TREPONEMAL ANTIBODIES, TRADITIONAL SCREENING AND DIAGNOSIS ALGORITHM  HIV ANTIBODY (ROUTINE TESTING W REFLEX)  GC/CHLAMYDIA PROBE AMP ([place]) NOT AT [facility]    EKG: None  Radiology: No results found.   Medications Ordered in the ED  metroNIDAZOLE  (FLAGYL ) tablet 2,000 mg (has no administration in time range)      {Click here for ABCD2, HEART and other calculators REFRESH Note before signing:1}                              Medical Decision Making Amount and/or Complexity of Data Reviewed Labs: ordered.  Risk Prescription drug management.     Patient presents to the ED for concern of STD exposure, this involves an extensive number of treatment options, and is a complaint that  carries with it a high risk of complications and morbidity.  The differential diagnosis includes trichomonas, gonorrhea, chlamydia, HIV, syphilis, UTI   Co morbidities that complicate the patient evaluation  None   Additional history obtained:  Additional history obtained from Nursing   External records from outside source obtained and reviewed including triage RN note   Lab Tests:  I Ordered, and personally interpreted labs.  The pertinent results include:   STD labs pending    Medicines ordered and prescription drug management:  I ordered medication including Metro for trichomonas Reevaluation of the patient after these medicines showed that the patient stayed the same I have reviewed the patients home  medicines and have made adjustments as needed    Problem List / ED Course:  Exposure to trichomonas Vital signs hemodynamically stable with no fever no tachycardia No abdominal tenderness.  Does have some dysuria but likely secondary to trichomonas infection.  Never had UTI nor kidney stone.  Low suspicion for these Reports that his partner tested positive for trichomonas and BV.  Discussed that BV is not an STD and cannot be transferred to him however we can treat him for trichomonas.  Provided 2 g metronidazole  here in Emergency Department for treatment.  Discussed importance of abstaining from sexual intercourse to avoid reexposure as well as alcohol as he took metronidazole  today He reports that patient tested negative for HIV, syphilis, gonorrhea, chlamydia however wants to be tested for these as well.  He defers GU exam.   Shared decision-making is had with patient regarding prophylactically treating for gonorrhea, chlamydia.  At this time, patient wishes to wait for results as his partner tested negative for these.  I discussed he can follow-up with results on MyChart and we will call him if he needs treatment Provided him with PCP establishment   Reevaluation:  After the interventions noted above, I reevaluated the patient and found that they have :stayed the same   Social Determinants of Health:  Tobacco abuse-provided tobacco use cessation   Dispostion:  After consideration of the diagnostic results and the patients response to treatment, I feel that the patent would benefit from outpatient management.   Discussed ED workup, disposition, return to ED precautions with patient who expresses understanding agrees with plan.  All questions answered to their satisfaction.  They are agreeable to plan.  Discharge instructions provided on paperwork  Final diagnoses:  STD exposure    ED Discharge Orders     None

## 2024-01-25 NOTE — Discharge Instructions (Addendum)
 Thank for letting us  evaluate you today.  Test results will result in the next 24-36 hours.  You can follow these results on your MyChart.  We will call you if you need treatment.  I have given you 1 dose of metronidazole  here in Emergency Department to treat for trichomonas.  Do not drink alcohol for the next 2 days at least as you will become extremely sick, vomiting with this antibiotic and alcohol.  Also please refrain from sexual intercourse for next 2 weeks with partner who tested positive for trichomonas as she may reinfect you  I provided you with primary care provider for routine medical complaints, annual visits.  Please establish with one.  You may also use health department in future for STD testing if needed

## 2024-01-25 NOTE — ED Triage Notes (Signed)
 Patients partner tested positive for BV and trich. Burning urination. No penile discharge.

## 2024-01-25 NOTE — ED Provider Notes (Incomplete)
   EMERGENCY DEPARTMENT AT [facility] Provider Note   CSN: [id number] Arrival date & time: 01/25/24  1820     Patient presents with: Exposure to STD   Phillip [name] is a 43 y.o. male. Dysuria for past 2 days.no testicular swelling. No dc Partner test + for BV and trich  {Add pertinent medical, surgical, social history, OB history to HPI:32947}  Exposure to STD       Prior to Admission medications   Medication Sig Start Date End Date Taking? Authorizing Provider  cyclobenzaprine  (FLEXERIL ) 10 MG tablet Take 1 tablet (10 mg total) by mouth 2 (two) times daily as needed for muscle spasms. 01/16/23   Kingsley, Victoria K, DO  lidocaine  (LIDODERM ) 5 % Place 1 patch onto the skin daily. Remove & Discard patch within 12 hours or as directed by MD 01/16/23   Kingsley, Victoria K, DO    Allergies: Patient has no known allergies.    Review of Systems  Updated Vital Signs BP (!) 130/90   Pulse 82   Temp 98 F (36.7 C) (Oral)   Resp 19   Ht 5' 6 (1.676 m)   Wt 67 kg   SpO2 100%   BMI 23.84 kg/m   Physical Exam  (all labs ordered are listed, but only abnormal results are displayed) Labs Reviewed - No data to display  EKG: None  Radiology: No results found.  {Document cardiac monitor, telemetry assessment procedure when appropriate:32947} Procedures   Medications Ordered in the ED - No data to display    {Click here for ABCD2, HEART and other calculators REFRESH Note before signing:1}                              Medical Decision Making Amount and/or Complexity of Data Reviewed Labs: ordered.   ***  {Document critical care time when appropriate  Document review of labs and clinical decision tools ie CHADS2VASC2, etc  Document your independent review of radiology images and any outside records  Document your discussion with family members, caretakers and with consultants  Document social determinants of health affecting pt's  care  Document your decision making why or why not admission, treatments were needed:32947:::1}   Final diagnoses:  None    ED Discharge Orders     None

## 2024-01-26 LAB — GC/CHLAMYDIA PROBE AMP (~~LOC~~) NOT AT ARMC
Chlamydia: NEGATIVE
Comment: NEGATIVE
Comment: NORMAL
Neisseria Gonorrhea: NEGATIVE

## 2024-01-26 LAB — HIV ANTIBODY (ROUTINE TESTING W REFLEX): HIV Screen 4th Generation wRfx: NONREACTIVE

## 2024-01-26 LAB — SYPHILIS: RPR W/REFLEX TO RPR TITER AND TREPONEMAL ANTIBODIES, TRADITIONAL SCREENING AND DIAGNOSIS ALGORITHM: RPR Ser Ql: NONREACTIVE
